# Patient Record
Sex: Male | Born: 1935 | Race: White | Hispanic: No | Smoking: Former smoker
Health system: Southern US, Community
[De-identification: ages and names within clinical notes are randomized; demographics above are authoritative.]

## PROBLEM LIST (undated history)

## (undated) DIAGNOSIS — Z992 Dependence on renal dialysis: Secondary | ICD-10-CM

## (undated) DIAGNOSIS — I10 Essential (primary) hypertension: Secondary | ICD-10-CM

## (undated) DIAGNOSIS — K219 Gastro-esophageal reflux disease without esophagitis: Secondary | ICD-10-CM

## (undated) DIAGNOSIS — N289 Disorder of kidney and ureter, unspecified: Secondary | ICD-10-CM

## (undated) DIAGNOSIS — N186 End stage renal disease: Secondary | ICD-10-CM

## (undated) DIAGNOSIS — I350 Nonrheumatic aortic (valve) stenosis: Secondary | ICD-10-CM

## (undated) DIAGNOSIS — E039 Hypothyroidism, unspecified: Secondary | ICD-10-CM

## (undated) DIAGNOSIS — M1990 Unspecified osteoarthritis, unspecified site: Secondary | ICD-10-CM

## (undated) HISTORY — PX: PROSTATE SURGERY: SHX751

## (undated) HISTORY — PX: EYE SURGERY: SHX253

## (undated) HISTORY — PX: TONSILLECTOMY: SUR1361

## (undated) HISTORY — PX: APPENDECTOMY: SHX54

## (undated) HISTORY — DX: Disorder of kidney and ureter, unspecified: N28.9

## (undated) HISTORY — PX: CATARACT EXTRACTION, BILATERAL: SHX1313

---

## 2002-12-18 DIAGNOSIS — C801 Malignant (primary) neoplasm, unspecified: Secondary | ICD-10-CM

## 2002-12-18 HISTORY — DX: Malignant (primary) neoplasm, unspecified: C80.1

## 2003-10-26 DIAGNOSIS — M129 Arthropathy, unspecified: Secondary | ICD-10-CM | POA: Insufficient documentation

## 2017-06-08 DIAGNOSIS — G4700 Insomnia, unspecified: Secondary | ICD-10-CM | POA: Insufficient documentation

## 2017-06-08 DIAGNOSIS — M545 Low back pain, unspecified: Secondary | ICD-10-CM | POA: Insufficient documentation

## 2017-06-08 DIAGNOSIS — I120 Hypertensive chronic kidney disease with stage 5 chronic kidney disease or end stage renal disease: Secondary | ICD-10-CM | POA: Insufficient documentation

## 2017-06-08 DIAGNOSIS — C61 Malignant neoplasm of prostate: Secondary | ICD-10-CM | POA: Insufficient documentation

## 2017-06-08 DIAGNOSIS — L400 Psoriasis vulgaris: Secondary | ICD-10-CM | POA: Insufficient documentation

## 2017-06-08 DIAGNOSIS — N140 Analgesic nephropathy: Secondary | ICD-10-CM | POA: Insufficient documentation

## 2017-06-08 DIAGNOSIS — G8929 Other chronic pain: Secondary | ICD-10-CM | POA: Insufficient documentation

## 2017-06-08 DIAGNOSIS — N2581 Secondary hyperparathyroidism of renal origin: Secondary | ICD-10-CM | POA: Insufficient documentation

## 2017-06-08 DIAGNOSIS — E039 Hypothyroidism, unspecified: Secondary | ICD-10-CM | POA: Insufficient documentation

## 2017-06-08 DIAGNOSIS — Z860100 Personal history of colon polyps, unspecified: Secondary | ICD-10-CM | POA: Insufficient documentation

## 2017-06-08 DIAGNOSIS — R32 Unspecified urinary incontinence: Secondary | ICD-10-CM | POA: Insufficient documentation

## 2017-06-08 DIAGNOSIS — Z791 Long term (current) use of non-steroidal anti-inflammatories (NSAID): Secondary | ICD-10-CM | POA: Insufficient documentation

## 2017-06-08 DIAGNOSIS — D509 Iron deficiency anemia, unspecified: Secondary | ICD-10-CM | POA: Insufficient documentation

## 2017-06-12 DIAGNOSIS — L299 Pruritus, unspecified: Secondary | ICD-10-CM | POA: Insufficient documentation

## 2017-06-12 DIAGNOSIS — D689 Coagulation defect, unspecified: Secondary | ICD-10-CM | POA: Insufficient documentation

## 2017-06-12 DIAGNOSIS — R0600 Dyspnea, unspecified: Secondary | ICD-10-CM | POA: Insufficient documentation

## 2017-06-12 DIAGNOSIS — R52 Pain, unspecified: Secondary | ICD-10-CM | POA: Insufficient documentation

## 2018-01-07 DIAGNOSIS — E039 Hypothyroidism, unspecified: Secondary | ICD-10-CM | POA: Diagnosis not present

## 2018-01-07 DIAGNOSIS — Z9181 History of falling: Secondary | ICD-10-CM | POA: Diagnosis not present

## 2018-01-07 DIAGNOSIS — N186 End stage renal disease: Secondary | ICD-10-CM | POA: Diagnosis not present

## 2018-01-07 DIAGNOSIS — N185 Chronic kidney disease, stage 5: Secondary | ICD-10-CM | POA: Diagnosis not present

## 2018-01-07 DIAGNOSIS — E559 Vitamin D deficiency, unspecified: Secondary | ICD-10-CM | POA: Diagnosis not present

## 2018-01-07 DIAGNOSIS — G4700 Insomnia, unspecified: Secondary | ICD-10-CM | POA: Diagnosis not present

## 2018-01-07 DIAGNOSIS — I129 Hypertensive chronic kidney disease with stage 1 through stage 4 chronic kidney disease, or unspecified chronic kidney disease: Secondary | ICD-10-CM | POA: Diagnosis not present

## 2018-01-07 DIAGNOSIS — Z1331 Encounter for screening for depression: Secondary | ICD-10-CM | POA: Diagnosis not present

## 2018-01-07 DIAGNOSIS — K5909 Other constipation: Secondary | ICD-10-CM | POA: Diagnosis not present

## 2018-01-07 DIAGNOSIS — E785 Hyperlipidemia, unspecified: Secondary | ICD-10-CM | POA: Diagnosis not present

## 2018-03-13 DIAGNOSIS — L578 Other skin changes due to chronic exposure to nonionizing radiation: Secondary | ICD-10-CM | POA: Diagnosis not present

## 2018-03-13 DIAGNOSIS — L821 Other seborrheic keratosis: Secondary | ICD-10-CM | POA: Diagnosis not present

## 2018-03-13 DIAGNOSIS — L570 Actinic keratosis: Secondary | ICD-10-CM | POA: Diagnosis not present

## 2018-05-08 DIAGNOSIS — E785 Hyperlipidemia, unspecified: Secondary | ICD-10-CM | POA: Diagnosis not present

## 2018-05-08 DIAGNOSIS — N185 Chronic kidney disease, stage 5: Secondary | ICD-10-CM | POA: Diagnosis not present

## 2018-05-08 DIAGNOSIS — Z6833 Body mass index (BMI) 33.0-33.9, adult: Secondary | ICD-10-CM | POA: Diagnosis not present

## 2018-05-08 DIAGNOSIS — E039 Hypothyroidism, unspecified: Secondary | ICD-10-CM | POA: Diagnosis not present

## 2018-05-08 DIAGNOSIS — I129 Hypertensive chronic kidney disease with stage 1 through stage 4 chronic kidney disease, or unspecified chronic kidney disease: Secondary | ICD-10-CM | POA: Diagnosis not present

## 2018-05-08 DIAGNOSIS — C61 Malignant neoplasm of prostate: Secondary | ICD-10-CM | POA: Diagnosis not present

## 2018-05-08 DIAGNOSIS — N186 End stage renal disease: Secondary | ICD-10-CM | POA: Diagnosis not present

## 2018-05-08 DIAGNOSIS — E559 Vitamin D deficiency, unspecified: Secondary | ICD-10-CM | POA: Diagnosis not present

## 2018-05-08 DIAGNOSIS — G4700 Insomnia, unspecified: Secondary | ICD-10-CM | POA: Diagnosis not present

## 2018-05-31 DIAGNOSIS — J208 Acute bronchitis due to other specified organisms: Secondary | ICD-10-CM | POA: Diagnosis not present

## 2018-06-14 DIAGNOSIS — C44319 Basal cell carcinoma of skin of other parts of face: Secondary | ICD-10-CM | POA: Diagnosis not present

## 2018-06-14 DIAGNOSIS — L821 Other seborrheic keratosis: Secondary | ICD-10-CM | POA: Diagnosis not present

## 2018-06-26 DIAGNOSIS — Z136 Encounter for screening for cardiovascular disorders: Secondary | ICD-10-CM | POA: Diagnosis not present

## 2018-06-26 DIAGNOSIS — E669 Obesity, unspecified: Secondary | ICD-10-CM | POA: Diagnosis not present

## 2018-06-26 DIAGNOSIS — Z1331 Encounter for screening for depression: Secondary | ICD-10-CM | POA: Diagnosis not present

## 2018-06-26 DIAGNOSIS — Z9181 History of falling: Secondary | ICD-10-CM | POA: Diagnosis not present

## 2018-06-26 DIAGNOSIS — Z6832 Body mass index (BMI) 32.0-32.9, adult: Secondary | ICD-10-CM | POA: Diagnosis not present

## 2018-06-26 DIAGNOSIS — Z0000 Encounter for general adult medical examination without abnormal findings: Secondary | ICD-10-CM | POA: Diagnosis not present

## 2018-06-26 DIAGNOSIS — E785 Hyperlipidemia, unspecified: Secondary | ICD-10-CM | POA: Diagnosis not present

## 2018-06-26 DIAGNOSIS — Z139 Encounter for screening, unspecified: Secondary | ICD-10-CM | POA: Diagnosis not present

## 2018-06-26 DIAGNOSIS — Z125 Encounter for screening for malignant neoplasm of prostate: Secondary | ICD-10-CM | POA: Diagnosis not present

## 2018-06-26 DIAGNOSIS — Z1339 Encounter for screening examination for other mental health and behavioral disorders: Secondary | ICD-10-CM | POA: Diagnosis not present

## 2018-07-08 DIAGNOSIS — C44329 Squamous cell carcinoma of skin of other parts of face: Secondary | ICD-10-CM | POA: Diagnosis not present

## 2018-08-21 DIAGNOSIS — Z23 Encounter for immunization: Secondary | ICD-10-CM | POA: Diagnosis not present

## 2018-08-21 DIAGNOSIS — S61412A Laceration without foreign body of left hand, initial encounter: Secondary | ICD-10-CM | POA: Diagnosis not present

## 2018-08-28 DIAGNOSIS — S61412A Laceration without foreign body of left hand, initial encounter: Secondary | ICD-10-CM | POA: Diagnosis not present

## 2018-08-28 DIAGNOSIS — G4700 Insomnia, unspecified: Secondary | ICD-10-CM | POA: Diagnosis not present

## 2018-08-28 DIAGNOSIS — J3489 Other specified disorders of nose and nasal sinuses: Secondary | ICD-10-CM | POA: Diagnosis not present

## 2018-08-28 DIAGNOSIS — Z6832 Body mass index (BMI) 32.0-32.9, adult: Secondary | ICD-10-CM | POA: Diagnosis not present

## 2018-09-05 DIAGNOSIS — G4700 Insomnia, unspecified: Secondary | ICD-10-CM | POA: Diagnosis not present

## 2018-09-05 DIAGNOSIS — N186 End stage renal disease: Secondary | ICD-10-CM | POA: Diagnosis not present

## 2018-09-05 DIAGNOSIS — E039 Hypothyroidism, unspecified: Secondary | ICD-10-CM | POA: Diagnosis not present

## 2018-09-05 DIAGNOSIS — E785 Hyperlipidemia, unspecified: Secondary | ICD-10-CM | POA: Diagnosis not present

## 2018-10-20 DIAGNOSIS — M79672 Pain in left foot: Secondary | ICD-10-CM | POA: Diagnosis not present

## 2018-11-26 DIAGNOSIS — L578 Other skin changes due to chronic exposure to nonionizing radiation: Secondary | ICD-10-CM | POA: Diagnosis not present

## 2018-11-26 DIAGNOSIS — C44329 Squamous cell carcinoma of skin of other parts of face: Secondary | ICD-10-CM | POA: Diagnosis not present

## 2018-11-26 DIAGNOSIS — L821 Other seborrheic keratosis: Secondary | ICD-10-CM | POA: Diagnosis not present

## 2018-11-26 DIAGNOSIS — L570 Actinic keratosis: Secondary | ICD-10-CM | POA: Diagnosis not present

## 2018-12-05 DIAGNOSIS — H02103 Unspecified ectropion of right eye, unspecified eyelid: Secondary | ICD-10-CM | POA: Diagnosis not present

## 2019-01-07 DIAGNOSIS — G4700 Insomnia, unspecified: Secondary | ICD-10-CM | POA: Diagnosis not present

## 2019-01-07 DIAGNOSIS — E785 Hyperlipidemia, unspecified: Secondary | ICD-10-CM | POA: Diagnosis not present

## 2019-01-07 DIAGNOSIS — E039 Hypothyroidism, unspecified: Secondary | ICD-10-CM | POA: Diagnosis not present

## 2019-01-07 DIAGNOSIS — N186 End stage renal disease: Secondary | ICD-10-CM | POA: Diagnosis not present

## 2019-02-25 DIAGNOSIS — E785 Hyperlipidemia, unspecified: Secondary | ICD-10-CM | POA: Diagnosis not present

## 2019-05-08 DIAGNOSIS — E785 Hyperlipidemia, unspecified: Secondary | ICD-10-CM | POA: Diagnosis not present

## 2019-05-08 DIAGNOSIS — C61 Malignant neoplasm of prostate: Secondary | ICD-10-CM | POA: Diagnosis not present

## 2019-05-08 DIAGNOSIS — G4700 Insomnia, unspecified: Secondary | ICD-10-CM | POA: Diagnosis not present

## 2019-05-08 DIAGNOSIS — K5909 Other constipation: Secondary | ICD-10-CM | POA: Diagnosis not present

## 2019-05-08 DIAGNOSIS — E039 Hypothyroidism, unspecified: Secondary | ICD-10-CM | POA: Diagnosis not present

## 2019-05-08 DIAGNOSIS — N186 End stage renal disease: Secondary | ICD-10-CM | POA: Diagnosis not present

## 2019-06-30 DIAGNOSIS — Z136 Encounter for screening for cardiovascular disorders: Secondary | ICD-10-CM | POA: Diagnosis not present

## 2019-06-30 DIAGNOSIS — E669 Obesity, unspecified: Secondary | ICD-10-CM | POA: Diagnosis not present

## 2019-06-30 DIAGNOSIS — Z6831 Body mass index (BMI) 31.0-31.9, adult: Secondary | ICD-10-CM | POA: Diagnosis not present

## 2019-06-30 DIAGNOSIS — Z0000 Encounter for general adult medical examination without abnormal findings: Secondary | ICD-10-CM | POA: Diagnosis not present

## 2019-06-30 DIAGNOSIS — Z139 Encounter for screening, unspecified: Secondary | ICD-10-CM | POA: Diagnosis not present

## 2019-06-30 DIAGNOSIS — Z9181 History of falling: Secondary | ICD-10-CM | POA: Diagnosis not present

## 2019-06-30 DIAGNOSIS — Z1331 Encounter for screening for depression: Secondary | ICD-10-CM | POA: Diagnosis not present

## 2019-06-30 DIAGNOSIS — E785 Hyperlipidemia, unspecified: Secondary | ICD-10-CM | POA: Diagnosis not present

## 2019-07-01 DIAGNOSIS — H02103 Unspecified ectropion of right eye, unspecified eyelid: Secondary | ICD-10-CM | POA: Diagnosis not present

## 2019-09-09 DIAGNOSIS — G8929 Other chronic pain: Secondary | ICD-10-CM | POA: Diagnosis not present

## 2019-09-09 DIAGNOSIS — E039 Hypothyroidism, unspecified: Secondary | ICD-10-CM | POA: Diagnosis not present

## 2019-09-09 DIAGNOSIS — N186 End stage renal disease: Secondary | ICD-10-CM | POA: Diagnosis not present

## 2019-09-09 DIAGNOSIS — Z992 Dependence on renal dialysis: Secondary | ICD-10-CM | POA: Diagnosis not present

## 2019-09-09 DIAGNOSIS — Z23 Encounter for immunization: Secondary | ICD-10-CM | POA: Diagnosis not present

## 2019-09-09 DIAGNOSIS — M545 Low back pain: Secondary | ICD-10-CM | POA: Diagnosis not present

## 2019-09-09 DIAGNOSIS — M25552 Pain in left hip: Secondary | ICD-10-CM | POA: Diagnosis not present

## 2019-09-09 DIAGNOSIS — G4700 Insomnia, unspecified: Secondary | ICD-10-CM | POA: Diagnosis not present

## 2019-09-09 DIAGNOSIS — M1612 Unilateral primary osteoarthritis, left hip: Secondary | ICD-10-CM | POA: Diagnosis not present

## 2019-09-09 DIAGNOSIS — E785 Hyperlipidemia, unspecified: Secondary | ICD-10-CM | POA: Diagnosis not present

## 2019-09-09 DIAGNOSIS — M5416 Radiculopathy, lumbar region: Secondary | ICD-10-CM | POA: Diagnosis not present

## 2019-10-02 DIAGNOSIS — M5136 Other intervertebral disc degeneration, lumbar region: Secondary | ICD-10-CM | POA: Diagnosis not present

## 2019-10-02 DIAGNOSIS — Z992 Dependence on renal dialysis: Secondary | ICD-10-CM | POA: Diagnosis not present

## 2019-10-02 DIAGNOSIS — N186 End stage renal disease: Secondary | ICD-10-CM | POA: Diagnosis not present

## 2019-10-09 DIAGNOSIS — M5136 Other intervertebral disc degeneration, lumbar region: Secondary | ICD-10-CM | POA: Diagnosis not present

## 2019-10-09 DIAGNOSIS — M48061 Spinal stenosis, lumbar region without neurogenic claudication: Secondary | ICD-10-CM | POA: Diagnosis not present

## 2019-10-09 DIAGNOSIS — M545 Low back pain: Secondary | ICD-10-CM | POA: Diagnosis not present

## 2019-10-16 DIAGNOSIS — M545 Low back pain: Secondary | ICD-10-CM | POA: Diagnosis not present

## 2019-11-06 DIAGNOSIS — M48061 Spinal stenosis, lumbar region without neurogenic claudication: Secondary | ICD-10-CM | POA: Diagnosis not present

## 2019-11-06 DIAGNOSIS — M545 Low back pain: Secondary | ICD-10-CM | POA: Diagnosis not present

## 2019-12-02 DIAGNOSIS — L821 Other seborrheic keratosis: Secondary | ICD-10-CM | POA: Diagnosis not present

## 2019-12-02 DIAGNOSIS — L570 Actinic keratosis: Secondary | ICD-10-CM | POA: Diagnosis not present

## 2019-12-02 DIAGNOSIS — L578 Other skin changes due to chronic exposure to nonionizing radiation: Secondary | ICD-10-CM | POA: Diagnosis not present

## 2020-01-15 DIAGNOSIS — Z992 Dependence on renal dialysis: Secondary | ICD-10-CM | POA: Diagnosis not present

## 2020-01-15 DIAGNOSIS — E785 Hyperlipidemia, unspecified: Secondary | ICD-10-CM | POA: Diagnosis not present

## 2020-01-15 DIAGNOSIS — M5136 Other intervertebral disc degeneration, lumbar region: Secondary | ICD-10-CM | POA: Diagnosis not present

## 2020-01-15 DIAGNOSIS — G4700 Insomnia, unspecified: Secondary | ICD-10-CM | POA: Diagnosis not present

## 2020-01-15 DIAGNOSIS — N186 End stage renal disease: Secondary | ICD-10-CM | POA: Diagnosis not present

## 2020-01-15 DIAGNOSIS — E039 Hypothyroidism, unspecified: Secondary | ICD-10-CM | POA: Diagnosis not present

## 2020-01-27 DIAGNOSIS — M545 Low back pain: Secondary | ICD-10-CM | POA: Diagnosis not present

## 2020-02-19 DIAGNOSIS — M545 Low back pain: Secondary | ICD-10-CM | POA: Diagnosis not present

## 2020-04-08 DIAGNOSIS — M545 Low back pain: Secondary | ICD-10-CM | POA: Diagnosis not present

## 2020-04-08 DIAGNOSIS — G8929 Other chronic pain: Secondary | ICD-10-CM | POA: Diagnosis not present

## 2020-04-08 DIAGNOSIS — M533 Sacrococcygeal disorders, not elsewhere classified: Secondary | ICD-10-CM | POA: Diagnosis not present

## 2020-04-09 DIAGNOSIS — M533 Sacrococcygeal disorders, not elsewhere classified: Secondary | ICD-10-CM | POA: Insufficient documentation

## 2020-04-21 DIAGNOSIS — M533 Sacrococcygeal disorders, not elsewhere classified: Secondary | ICD-10-CM | POA: Diagnosis not present

## 2020-05-13 DIAGNOSIS — F5104 Psychophysiologic insomnia: Secondary | ICD-10-CM | POA: Diagnosis not present

## 2020-05-13 DIAGNOSIS — N186 End stage renal disease: Secondary | ICD-10-CM | POA: Diagnosis not present

## 2020-05-13 DIAGNOSIS — E039 Hypothyroidism, unspecified: Secondary | ICD-10-CM | POA: Diagnosis not present

## 2020-05-13 DIAGNOSIS — E785 Hyperlipidemia, unspecified: Secondary | ICD-10-CM | POA: Diagnosis not present

## 2020-05-13 DIAGNOSIS — R002 Palpitations: Secondary | ICD-10-CM | POA: Diagnosis not present

## 2020-05-13 DIAGNOSIS — Z992 Dependence on renal dialysis: Secondary | ICD-10-CM | POA: Diagnosis not present

## 2020-05-26 DIAGNOSIS — M5136 Other intervertebral disc degeneration, lumbar region: Secondary | ICD-10-CM | POA: Diagnosis not present

## 2020-05-26 DIAGNOSIS — M47816 Spondylosis without myelopathy or radiculopathy, lumbar region: Secondary | ICD-10-CM | POA: Insufficient documentation

## 2020-05-26 DIAGNOSIS — M48061 Spinal stenosis, lumbar region without neurogenic claudication: Secondary | ICD-10-CM | POA: Diagnosis not present

## 2020-05-26 DIAGNOSIS — M533 Sacrococcygeal disorders, not elsewhere classified: Secondary | ICD-10-CM | POA: Diagnosis not present

## 2020-06-16 DIAGNOSIS — S51812A Laceration without foreign body of left forearm, initial encounter: Secondary | ICD-10-CM | POA: Diagnosis not present

## 2020-06-17 DIAGNOSIS — S51802A Unspecified open wound of left forearm, initial encounter: Secondary | ICD-10-CM | POA: Diagnosis not present

## 2020-07-01 DIAGNOSIS — H04123 Dry eye syndrome of bilateral lacrimal glands: Secondary | ICD-10-CM | POA: Diagnosis not present

## 2020-07-01 DIAGNOSIS — H04003 Unspecified dacryoadenitis, bilateral lacrimal glands: Secondary | ICD-10-CM | POA: Diagnosis not present

## 2020-07-06 DIAGNOSIS — Z1331 Encounter for screening for depression: Secondary | ICD-10-CM | POA: Diagnosis not present

## 2020-07-06 DIAGNOSIS — Z0000 Encounter for general adult medical examination without abnormal findings: Secondary | ICD-10-CM | POA: Diagnosis not present

## 2020-07-06 DIAGNOSIS — Z6831 Body mass index (BMI) 31.0-31.9, adult: Secondary | ICD-10-CM | POA: Diagnosis not present

## 2020-07-06 DIAGNOSIS — Z139 Encounter for screening, unspecified: Secondary | ICD-10-CM | POA: Diagnosis not present

## 2020-07-06 DIAGNOSIS — Z9181 History of falling: Secondary | ICD-10-CM | POA: Diagnosis not present

## 2020-07-06 DIAGNOSIS — E785 Hyperlipidemia, unspecified: Secondary | ICD-10-CM | POA: Diagnosis not present

## 2020-08-24 DIAGNOSIS — M48061 Spinal stenosis, lumbar region without neurogenic claudication: Secondary | ICD-10-CM | POA: Diagnosis not present

## 2020-08-24 DIAGNOSIS — M47816 Spondylosis without myelopathy or radiculopathy, lumbar region: Secondary | ICD-10-CM | POA: Diagnosis not present

## 2020-08-24 DIAGNOSIS — M533 Sacrococcygeal disorders, not elsewhere classified: Secondary | ICD-10-CM | POA: Diagnosis not present

## 2020-08-31 DIAGNOSIS — H5213 Myopia, bilateral: Secondary | ICD-10-CM | POA: Diagnosis not present

## 2020-08-31 DIAGNOSIS — H40031 Anatomical narrow angle, right eye: Secondary | ICD-10-CM | POA: Diagnosis not present

## 2020-09-23 DIAGNOSIS — R0602 Shortness of breath: Secondary | ICD-10-CM | POA: Diagnosis not present

## 2020-09-23 DIAGNOSIS — E785 Hyperlipidemia, unspecified: Secondary | ICD-10-CM | POA: Diagnosis not present

## 2020-09-23 DIAGNOSIS — J42 Unspecified chronic bronchitis: Secondary | ICD-10-CM | POA: Diagnosis not present

## 2020-09-23 DIAGNOSIS — E039 Hypothyroidism, unspecified: Secondary | ICD-10-CM | POA: Diagnosis not present

## 2020-09-23 DIAGNOSIS — R059 Cough, unspecified: Secondary | ICD-10-CM | POA: Diagnosis not present

## 2020-09-23 DIAGNOSIS — G4700 Insomnia, unspecified: Secondary | ICD-10-CM | POA: Diagnosis not present

## 2020-09-23 DIAGNOSIS — I700 Atherosclerosis of aorta: Secondary | ICD-10-CM | POA: Diagnosis not present

## 2020-09-23 DIAGNOSIS — Z992 Dependence on renal dialysis: Secondary | ICD-10-CM | POA: Diagnosis not present

## 2020-09-23 DIAGNOSIS — Z23 Encounter for immunization: Secondary | ICD-10-CM | POA: Diagnosis not present

## 2020-09-23 DIAGNOSIS — N186 End stage renal disease: Secondary | ICD-10-CM | POA: Diagnosis not present

## 2020-10-05 DIAGNOSIS — N62 Hypertrophy of breast: Secondary | ICD-10-CM | POA: Diagnosis not present

## 2020-10-05 DIAGNOSIS — N6310 Unspecified lump in the right breast, unspecified quadrant: Secondary | ICD-10-CM | POA: Diagnosis not present

## 2020-10-14 DIAGNOSIS — N6311 Unspecified lump in the right breast, upper outer quadrant: Secondary | ICD-10-CM | POA: Diagnosis not present

## 2020-10-14 DIAGNOSIS — N62 Hypertrophy of breast: Secondary | ICD-10-CM | POA: Diagnosis not present

## 2020-11-22 DIAGNOSIS — J208 Acute bronchitis due to other specified organisms: Secondary | ICD-10-CM | POA: Diagnosis not present

## 2020-11-22 DIAGNOSIS — B9689 Other specified bacterial agents as the cause of diseases classified elsewhere: Secondary | ICD-10-CM | POA: Diagnosis not present

## 2020-11-22 DIAGNOSIS — R55 Syncope and collapse: Secondary | ICD-10-CM | POA: Diagnosis not present

## 2020-12-02 DIAGNOSIS — L821 Other seborrheic keratosis: Secondary | ICD-10-CM | POA: Diagnosis not present

## 2020-12-02 DIAGNOSIS — L578 Other skin changes due to chronic exposure to nonionizing radiation: Secondary | ICD-10-CM | POA: Diagnosis not present

## 2020-12-02 DIAGNOSIS — L570 Actinic keratosis: Secondary | ICD-10-CM | POA: Diagnosis not present

## 2020-12-02 DIAGNOSIS — L728 Other follicular cysts of the skin and subcutaneous tissue: Secondary | ICD-10-CM | POA: Diagnosis not present

## 2020-12-14 DIAGNOSIS — M5136 Other intervertebral disc degeneration, lumbar region: Secondary | ICD-10-CM | POA: Diagnosis not present

## 2020-12-14 DIAGNOSIS — M47816 Spondylosis without myelopathy or radiculopathy, lumbar region: Secondary | ICD-10-CM | POA: Diagnosis not present

## 2020-12-14 DIAGNOSIS — M48061 Spinal stenosis, lumbar region without neurogenic claudication: Secondary | ICD-10-CM | POA: Diagnosis not present

## 2021-01-06 DIAGNOSIS — M47816 Spondylosis without myelopathy or radiculopathy, lumbar region: Secondary | ICD-10-CM | POA: Diagnosis not present

## 2021-01-20 DIAGNOSIS — M47816 Spondylosis without myelopathy or radiculopathy, lumbar region: Secondary | ICD-10-CM | POA: Diagnosis not present

## 2021-01-25 DIAGNOSIS — J42 Unspecified chronic bronchitis: Secondary | ICD-10-CM | POA: Diagnosis not present

## 2021-01-25 DIAGNOSIS — G4700 Insomnia, unspecified: Secondary | ICD-10-CM | POA: Diagnosis not present

## 2021-01-25 DIAGNOSIS — E039 Hypothyroidism, unspecified: Secondary | ICD-10-CM | POA: Diagnosis not present

## 2021-01-25 DIAGNOSIS — Z992 Dependence on renal dialysis: Secondary | ICD-10-CM | POA: Diagnosis not present

## 2021-01-25 DIAGNOSIS — N186 End stage renal disease: Secondary | ICD-10-CM | POA: Diagnosis not present

## 2021-01-25 DIAGNOSIS — E785 Hyperlipidemia, unspecified: Secondary | ICD-10-CM | POA: Diagnosis not present

## 2021-02-17 DIAGNOSIS — M47816 Spondylosis without myelopathy or radiculopathy, lumbar region: Secondary | ICD-10-CM | POA: Diagnosis not present

## 2021-02-25 ENCOUNTER — Telehealth: Payer: Self-pay

## 2021-02-25 NOTE — Telephone Encounter (Signed)
I called and spoke to patient reminding him that he needs to contact his [facility] so that he will be authorized for a new patient appt.  Pt verbalized understanding and said he is with the [facility] and appointments normally go through [facility]Dr. He will contact them ASAP.

## 2021-03-04 ENCOUNTER — Other Ambulatory Visit: Payer: Self-pay

## 2021-03-10 ENCOUNTER — Other Ambulatory Visit: Payer: Self-pay

## 2021-03-15 DIAGNOSIS — N390 Urinary tract infection, site not specified: Secondary | ICD-10-CM | POA: Diagnosis not present

## 2021-03-22 ENCOUNTER — Encounter: Payer: Non-veteran care | Admitting: Vascular Surgery

## 2021-03-22 ENCOUNTER — Other Ambulatory Visit (HOSPITAL_COMMUNITY): Payer: Non-veteran care

## 2021-05-24 DIAGNOSIS — Z992 Dependence on renal dialysis: Secondary | ICD-10-CM | POA: Diagnosis not present

## 2021-05-24 DIAGNOSIS — J42 Unspecified chronic bronchitis: Secondary | ICD-10-CM | POA: Diagnosis not present

## 2021-05-24 DIAGNOSIS — E039 Hypothyroidism, unspecified: Secondary | ICD-10-CM | POA: Diagnosis not present

## 2021-05-24 DIAGNOSIS — K5909 Other constipation: Secondary | ICD-10-CM | POA: Diagnosis not present

## 2021-05-24 DIAGNOSIS — N186 End stage renal disease: Secondary | ICD-10-CM | POA: Diagnosis not present

## 2021-05-24 DIAGNOSIS — E785 Hyperlipidemia, unspecified: Secondary | ICD-10-CM | POA: Diagnosis not present

## 2021-05-24 DIAGNOSIS — G4700 Insomnia, unspecified: Secondary | ICD-10-CM | POA: Diagnosis not present

## 2021-07-05 ENCOUNTER — Encounter: Payer: Non-veteran care | Admitting: Vascular Surgery

## 2021-07-08 DIAGNOSIS — S51011A Laceration without foreign body of right elbow, initial encounter: Secondary | ICD-10-CM | POA: Diagnosis not present

## 2021-07-19 ENCOUNTER — Other Ambulatory Visit: Payer: Self-pay

## 2021-07-19 DIAGNOSIS — N186 End stage renal disease: Secondary | ICD-10-CM | POA: Insufficient documentation

## 2021-07-19 DIAGNOSIS — I129 Hypertensive chronic kidney disease with stage 1 through stage 4 chronic kidney disease, or unspecified chronic kidney disease: Secondary | ICD-10-CM | POA: Insufficient documentation

## 2021-07-19 DIAGNOSIS — N200 Calculus of kidney: Secondary | ICD-10-CM | POA: Insufficient documentation

## 2021-07-19 DIAGNOSIS — E559 Vitamin D deficiency, unspecified: Secondary | ICD-10-CM | POA: Insufficient documentation

## 2021-07-19 DIAGNOSIS — J309 Allergic rhinitis, unspecified: Secondary | ICD-10-CM | POA: Insufficient documentation

## 2021-07-19 DIAGNOSIS — N189 Chronic kidney disease, unspecified: Secondary | ICD-10-CM | POA: Insufficient documentation

## 2021-07-19 DIAGNOSIS — D631 Anemia in chronic kidney disease: Secondary | ICD-10-CM | POA: Insufficient documentation

## 2021-07-19 DIAGNOSIS — E785 Hyperlipidemia, unspecified: Secondary | ICD-10-CM | POA: Insufficient documentation

## 2021-07-19 DIAGNOSIS — N183 Chronic kidney disease, stage 3 unspecified: Secondary | ICD-10-CM | POA: Insufficient documentation

## 2021-07-19 DIAGNOSIS — I10 Essential (primary) hypertension: Secondary | ICD-10-CM | POA: Insufficient documentation

## 2021-07-19 DIAGNOSIS — H9313 Tinnitus, bilateral: Secondary | ICD-10-CM | POA: Insufficient documentation

## 2021-07-19 DIAGNOSIS — E1122 Type 2 diabetes mellitus with diabetic chronic kidney disease: Secondary | ICD-10-CM | POA: Insufficient documentation

## 2021-07-19 DIAGNOSIS — K219 Gastro-esophageal reflux disease without esophagitis: Secondary | ICD-10-CM | POA: Insufficient documentation

## 2021-07-19 DIAGNOSIS — N184 Chronic kidney disease, stage 4 (severe): Secondary | ICD-10-CM | POA: Insufficient documentation

## 2021-07-19 DIAGNOSIS — E669 Obesity, unspecified: Secondary | ICD-10-CM | POA: Insufficient documentation

## 2021-07-29 DIAGNOSIS — R0981 Nasal congestion: Secondary | ICD-10-CM | POA: Insufficient documentation

## 2021-07-29 DIAGNOSIS — R070 Pain in throat: Secondary | ICD-10-CM | POA: Insufficient documentation

## 2021-08-02 ENCOUNTER — Ambulatory Visit (HOSPITAL_COMMUNITY): Payer: Non-veteran care

## 2021-08-02 ENCOUNTER — Encounter: Payer: Non-veteran care | Admitting: Vascular Surgery

## 2021-08-12 DIAGNOSIS — Z8616 Personal history of COVID-19: Secondary | ICD-10-CM | POA: Insufficient documentation

## 2021-08-23 ENCOUNTER — Encounter: Payer: Self-pay | Admitting: Vascular Surgery

## 2021-08-23 ENCOUNTER — Other Ambulatory Visit: Payer: Self-pay

## 2021-08-23 ENCOUNTER — Ambulatory Visit (INDEPENDENT_AMBULATORY_CARE_PROVIDER_SITE_OTHER): Payer: No Typology Code available for payment source | Admitting: Vascular Surgery

## 2021-08-23 VITALS — BP 150/78 | HR 70 | Temp 97.5°F | Resp 18 | Ht 69.0 in | Wt 190.0 lb

## 2021-08-23 DIAGNOSIS — N186 End stage renal disease: Secondary | ICD-10-CM

## 2021-08-23 NOTE — Progress Notes (Signed)
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  Patient states his fistula was placed at [facility] about 4-5 years ago.  He is here with his daughter-in-law and has been on dialysis about 4 to 5 years.  He has had no significant bleeding events.  He has no open ulcerations or scabs at this time.  Sounds like the fistula is working well and has had one previous intervention and [facility].  Past Medical History:  Diagnosis Date   Kidney disease     Past Surgical History:  Procedure Laterality Date   APPENDECTOMY     PROSTATE SURGERY      Family History  Problem Relation Age of Onset   Cancer Mother    Heart failure Father     SOCIAL HISTORY: Social History   Socioeconomic History   Marital status: Unknown    Spouse name: Not on file   Number of children: Not on file   Years of education: Not on file   Highest education level: Not on file  Occupational History   Not on file  Tobacco Use   Smoking status: Former    Types: Cigarettes    Quit date: 63    Years since quitting: 50.7   Smokeless tobacco: Former  Substance and Sexual Activity   Alcohol use: Not Currently   Drug use: Never   Sexual activity: Not on file  Other Topics Concern   Not on file  Social History Narrative   Not on file   Social Determinants of Health   Financial Resource Strain: Not on file  Food Insecurity: Not on file  Transportation Needs: Not on file  Physical Activity: Not on file  Stress: Not on file  Social Connections: Not on file  Intimate Partner Violence: Not on file    Allergies  Allergen Reactions   Penicillins Anaphylaxis, Rash and Other (See Comments)    Fainted  like Fainted like    Pravastatin Other (See Comments)   Simvastatin Other (See Comments)    Current Outpatient Medications  Medication Sig Dispense Refill   aspirin 81 MG EC tablet Take by mouth.     b complex-C-folic acid 1 MG capsule Take 1 capsule by mouth at bedtime.     calcium acetate (PHOSLO) 667 MG capsule TAKE TWO CAPSULES BY MOUTH TWICE DAILYAT LUNCH AND SUPPER     calcium acetate (PHOSLO) 667 MG capsule Take 1,334 mg by mouth 2 (two) times daily.     gabapentin (NEURONTIN) 100 MG capsule Take by mouth.     levothyroxine (SYNTHROID) 88 MCG tablet Take by mouth.     lidocaine (XYLOCAINE) 5 % ointment APPLY A SMALL AMOUNT TO AFFECTED AREA TWICE DAILY AS NEEDED NECK, BACK OR JOINT PAIN     melatonin 3 MG TABS tablet TAKE ONE CAP/TAB BY MOUTH AT BEDTIME AS NEEDED FOR SLEEP     Menthol-Methyl Salicylate (THERA-GESIC) 0.5-15 % CREA APPLY A SMALL AMOUNT TO AFFECTED AREA FOUR TIMES A DAY NECK, BACK OR JOINT PAIN     multivitamin (RENA-VIT) TABS tablet Take by mouth.     Omega-3 Fatty Acids (SM FISH OIL) 1000 MG CAPS Take by mouth.  rosuvastatin (CRESTOR) 20 MG tablet Take 20 mg by mouth daily.     temazepam (RESTORIL) 30 MG capsule Take 30 mg by mouth at bedtime as needed.     No current facility-administered medications for this visit.    REVIEW OF SYSTEMS:  [X]  denotes positive finding, [ ]  denotes negative finding Cardiac  Comments:  Chest pain or chest pressure:    Shortness of breath upon exertion:    Short of breath when lying flat:    Irregular heart rhythm:        Vascular    Pain in calf, thigh, or hip brought on by ambulation:    Pain in feet at night that wakes you up from your sleep:     Blood clot in your veins:    Leg swelling:         Pulmonary    Oxygen at home:    Productive cough:     Wheezing:         Neurologic    Sudden weakness in arms or legs:     Sudden numbness in arms or legs:     Sudden onset of difficulty speaking or slurred speech:     Temporary loss of vision in one eye:     Problems with dizziness:         Gastrointestinal    Blood in stool:     Vomited blood:         Genitourinary    Burning when urinating:     Blood in urine:        Psychiatric    Major depression:         Hematologic    Bleeding problems:    Problems with blood clotting too easily:        Skin    Rashes or ulcers:        Constitutional    Fever or chills:      PHYSICAL EXAM: Vitals:   08/23/21 1116  BP: (!) 150/78  Pulse: 70  Resp: 18  Temp: (!) 97.5 F (36.4 C)  TempSrc: Temporal  SpO2: 93%  Weight: 190 lb (86.2 kg)  Height: 5\' 9"  (1.753 m)    GENERAL: The patient is a well-nourished male, in no acute distress. The vital signs are documented above. CARDIAC: There is a regular rate and rhythm.  VASCULAR:  Left radiocephalic AV fistula with excellent thrill. Aneurysmal segment of AVF pictured below. PULMONARY: No respiratory distress ABDOMEN: Soft and non-tender. MUSCULOSKELETAL: There are no major deformities or cyanosis. NEUROLOGIC: No focal weakness or paresthesias are detected. SKIN: There are no ulcers or rashes noted. PSYCHIATRIC: The patient has a normal affect.     DATA:   None  Assessment/Plan:  87 year old male presents for evaluation of aneurysmal segment of his left radiocephalic AV fistula.  As pictured, above the fistula does have an aneurysmal segment but I do not see any open ulcerations or scabs.  The skin here has some hypopigmentation but appears intact.  I do not see any immediate indication for revision as I discussed with him and his daughter-in-law today.  I will have him come back in a month to see one of our PAs just for another check on his fistula and ensure there has been no interval change.  I did give him my business card and discussed he call if he has any immediate concerns and we would be happy to see him back.   Marty Heck, MD [facility]  [place] Office: [phone]

## 2021-09-06 DIAGNOSIS — H40013 Open angle with borderline findings, low risk, bilateral: Secondary | ICD-10-CM | POA: Diagnosis not present

## 2021-09-20 ENCOUNTER — Encounter: Payer: Self-pay | Admitting: Physician Assistant

## 2021-09-20 ENCOUNTER — Other Ambulatory Visit: Payer: Self-pay

## 2021-09-20 ENCOUNTER — Ambulatory Visit (INDEPENDENT_AMBULATORY_CARE_PROVIDER_SITE_OTHER): Payer: No Typology Code available for payment source | Admitting: Physician Assistant

## 2021-09-20 VITALS — BP 153/71 | HR 73 | Temp 97.5°F | Resp 20 | Ht 69.0 in | Wt 191.5 lb

## 2021-09-20 DIAGNOSIS — N186 End stage renal disease: Secondary | ICD-10-CM | POA: Diagnosis not present

## 2021-09-20 DIAGNOSIS — R059 Cough, unspecified: Secondary | ICD-10-CM | POA: Insufficient documentation

## 2021-09-20 DIAGNOSIS — H903 Sensorineural hearing loss, bilateral: Secondary | ICD-10-CM | POA: Insufficient documentation

## 2021-09-20 DIAGNOSIS — R809 Proteinuria, unspecified: Secondary | ICD-10-CM | POA: Insufficient documentation

## 2021-09-20 DIAGNOSIS — E872 Acidosis, unspecified: Secondary | ICD-10-CM | POA: Insufficient documentation

## 2021-09-20 DIAGNOSIS — N250 Renal osteodystrophy: Secondary | ICD-10-CM | POA: Insufficient documentation

## 2021-09-20 DIAGNOSIS — Q619 Cystic kidney disease, unspecified: Secondary | ICD-10-CM | POA: Insufficient documentation

## 2021-09-20 DIAGNOSIS — K5900 Constipation, unspecified: Secondary | ICD-10-CM | POA: Insufficient documentation

## 2021-09-20 DIAGNOSIS — R319 Hematuria, unspecified: Secondary | ICD-10-CM | POA: Insufficient documentation

## 2021-09-20 DIAGNOSIS — R519 Headache, unspecified: Secondary | ICD-10-CM | POA: Insufficient documentation

## 2021-09-20 DIAGNOSIS — J329 Chronic sinusitis, unspecified: Secondary | ICD-10-CM | POA: Insufficient documentation

## 2021-09-20 DIAGNOSIS — M25519 Pain in unspecified shoulder: Secondary | ICD-10-CM | POA: Insufficient documentation

## 2021-09-20 DIAGNOSIS — H524 Presbyopia: Secondary | ICD-10-CM | POA: Insufficient documentation

## 2021-09-20 DIAGNOSIS — M159 Polyosteoarthritis, unspecified: Secondary | ICD-10-CM | POA: Insufficient documentation

## 2021-09-20 NOTE — Progress Notes (Signed)
  POST OPERATIVE OFFICE NOTE    CC:  F/u for surgery  HPI:  This is a 87 y.o. male who is for follow up check on his left AV fistula with aneurysmal.  He denise prolonged bleeding, difficulty with HD, or symptoms of steal.  The fistula was created 5 years ago a t the [place]. He was seen by Dr. Carlis Abbott and is here to f/u.  He has HD MWF in [place].      Allergies  Allergen Reactions   Penicillins Anaphylaxis, Rash and Other (See Comments)    Fainted like Fainted like    Pravastatin Other (See Comments)   Simvastatin Other (See Comments)    Current Outpatient Medications  Medication Sig Dispense Refill   aspirin 81 MG EC tablet Take by mouth.     b complex-C-folic acid 1 MG capsule Take 1 capsule by mouth at bedtime.     calcium acetate (PHOSLO) 667 MG capsule TAKE TWO CAPSULES BY MOUTH TWICE DAILYAT LUNCH AND SUPPER     calcium acetate (PHOSLO) 667 MG capsule Take 1,334 mg by mouth 2 (two) times daily.     Cinacalcet HCl (SENSIPAR PO) Take by mouth.     ethyl chloride spray PLEASE SEE ATTACHED FOR DETAILED DIRECTIONS     famotidine (PEPCID) 40 MG tablet Take 1 tablet by mouth at bedtime.     gabapentin (NEURONTIN) 100 MG capsule Take by mouth.     heparin 1000 unit/mL SOLN injection Heparin Sodium (Porcine) 1,000 Units/mL Systemic     levothyroxine (SYNTHROID) 88 MCG tablet Take by mouth.     lidocaine (XYLOCAINE) 5 % ointment APPLY A SMALL AMOUNT TO AFFECTED AREA TWICE DAILY AS NEEDED NECK, BACK OR JOINT PAIN     linaclotide (LINZESS) 290 MCG CAPS capsule TAKE 1 CAPSULE BY MOUTH EVERY OTHER DAY AS NEEDED     melatonin 3 MG TABS tablet TAKE ONE CAP/TAB BY MOUTH AT BEDTIME AS NEEDED FOR SLEEP     Menthol-Methyl Salicylate (THERA-GESIC) 0.5-15 % CREA APPLY A SMALL AMOUNT TO AFFECTED AREA FOUR TIMES A DAY NECK, BACK OR JOINT PAIN     Methoxy PEG-Epoetin Beta (MIRCERA IJ) Mircera     multivitamin (RENA-VIT) TABS tablet Take by mouth.     omega-3 acid ethyl esters (LOVAZA) 1 g capsule Take  by mouth.     rosuvastatin (CRESTOR) 20 MG tablet Take 20 mg by mouth daily.     senna-docusate (SENOKOT-S) 8.6-50 MG tablet TAKE 2 TABLETS BY MOUTH ONCE DAILY CONSTIPATION     temazepam (RESTORIL) 30 MG capsule Take 30 mg by mouth at bedtime as needed.     VITAMIN D PO Take by mouth.     No current facility-administered medications for this visit.     ROS:  See HPI  Physical Exam:     Extremities:  palpable thrill in fistula, radial pulses palpable left UE, grip 5/5 He has thinning skin over the dorsum of the fistula, no ulcer/scab, or erythema.     Assessment/Plan:  This is a 87 y.o. male who is s/p: The fistula appears viable.  The only reason we would plicate the fistula is if he had prolonged bleeding, problems with HD flow rates, increased aneurysmal size in a short time or ulcer/scab formation.  F/U PRN.  No intervention is needed at this time.       Roxy Horseman PA-C [facility] [phone]   Clinic MD:  Carlis Abbott

## 2021-09-27 DIAGNOSIS — G4700 Insomnia, unspecified: Secondary | ICD-10-CM | POA: Diagnosis not present

## 2021-09-27 DIAGNOSIS — Z992 Dependence on renal dialysis: Secondary | ICD-10-CM | POA: Diagnosis not present

## 2021-09-27 DIAGNOSIS — E039 Hypothyroidism, unspecified: Secondary | ICD-10-CM | POA: Diagnosis not present

## 2021-09-27 DIAGNOSIS — K5909 Other constipation: Secondary | ICD-10-CM | POA: Diagnosis not present

## 2021-09-27 DIAGNOSIS — N186 End stage renal disease: Secondary | ICD-10-CM | POA: Diagnosis not present

## 2021-09-27 DIAGNOSIS — E785 Hyperlipidemia, unspecified: Secondary | ICD-10-CM | POA: Diagnosis not present

## 2021-09-27 DIAGNOSIS — J42 Unspecified chronic bronchitis: Secondary | ICD-10-CM | POA: Diagnosis not present

## 2021-12-06 DIAGNOSIS — L578 Other skin changes due to chronic exposure to nonionizing radiation: Secondary | ICD-10-CM | POA: Diagnosis not present

## 2021-12-06 DIAGNOSIS — L821 Other seborrheic keratosis: Secondary | ICD-10-CM | POA: Diagnosis not present

## 2021-12-06 DIAGNOSIS — L570 Actinic keratosis: Secondary | ICD-10-CM | POA: Diagnosis not present

## 2022-01-28 DIAGNOSIS — K5909 Other constipation: Secondary | ICD-10-CM | POA: Diagnosis not present

## 2022-01-31 DIAGNOSIS — G4700 Insomnia, unspecified: Secondary | ICD-10-CM | POA: Diagnosis not present

## 2022-01-31 DIAGNOSIS — J42 Unspecified chronic bronchitis: Secondary | ICD-10-CM | POA: Diagnosis not present

## 2022-01-31 DIAGNOSIS — K5909 Other constipation: Secondary | ICD-10-CM | POA: Diagnosis not present

## 2022-01-31 DIAGNOSIS — E785 Hyperlipidemia, unspecified: Secondary | ICD-10-CM | POA: Diagnosis not present

## 2022-01-31 DIAGNOSIS — Z992 Dependence on renal dialysis: Secondary | ICD-10-CM | POA: Diagnosis not present

## 2022-01-31 DIAGNOSIS — M65351 Trigger finger, right little finger: Secondary | ICD-10-CM | POA: Diagnosis not present

## 2022-01-31 DIAGNOSIS — N186 End stage renal disease: Secondary | ICD-10-CM | POA: Diagnosis not present

## 2022-01-31 DIAGNOSIS — E039 Hypothyroidism, unspecified: Secondary | ICD-10-CM | POA: Diagnosis not present

## 2022-02-15 DIAGNOSIS — I639 Cerebral infarction, unspecified: Secondary | ICD-10-CM

## 2022-02-15 HISTORY — DX: Cerebral infarction, unspecified: I63.9

## 2022-02-20 ENCOUNTER — Observation Stay (HOSPITAL_BASED_OUTPATIENT_CLINIC_OR_DEPARTMENT_OTHER): Payer: No Typology Code available for payment source

## 2022-02-20 ENCOUNTER — Emergency Department (HOSPITAL_COMMUNITY): Payer: No Typology Code available for payment source

## 2022-02-20 ENCOUNTER — Encounter (HOSPITAL_COMMUNITY): Payer: Self-pay

## 2022-02-20 ENCOUNTER — Inpatient Hospital Stay (HOSPITAL_COMMUNITY)
Admission: EM | Admit: 2022-02-20 | Discharge: 2022-02-22 | DRG: 067 | Disposition: A | Discharge status: Home Health | Payer: No Typology Code available for payment source | Attending: Family Medicine | Admitting: Family Medicine

## 2022-02-20 DIAGNOSIS — I779 Disorder of arteries and arterioles, unspecified: Secondary | ICD-10-CM | POA: Diagnosis not present

## 2022-02-20 DIAGNOSIS — Z87891 Personal history of nicotine dependence: Secondary | ICD-10-CM | POA: Diagnosis not present

## 2022-02-20 DIAGNOSIS — E1122 Type 2 diabetes mellitus with diabetic chronic kidney disease: Secondary | ICD-10-CM | POA: Diagnosis present

## 2022-02-20 DIAGNOSIS — I6523 Occlusion and stenosis of bilateral carotid arteries: Secondary | ICD-10-CM | POA: Diagnosis not present

## 2022-02-20 DIAGNOSIS — I120 Hypertensive chronic kidney disease with stage 5 chronic kidney disease or end stage renal disease: Secondary | ICD-10-CM | POA: Diagnosis present

## 2022-02-20 DIAGNOSIS — Z888 Allergy status to other drugs, medicaments and biological substances status: Secondary | ICD-10-CM | POA: Diagnosis not present

## 2022-02-20 DIAGNOSIS — G8929 Other chronic pain: Secondary | ICD-10-CM | POA: Diagnosis not present

## 2022-02-20 DIAGNOSIS — Z8546 Personal history of malignant neoplasm of prostate: Secondary | ICD-10-CM | POA: Diagnosis not present

## 2022-02-20 DIAGNOSIS — H8110 Benign paroxysmal vertigo, unspecified ear: Secondary | ICD-10-CM | POA: Diagnosis not present

## 2022-02-20 DIAGNOSIS — Z880 Allergy status to penicillin: Secondary | ICD-10-CM | POA: Diagnosis not present

## 2022-02-20 DIAGNOSIS — R42 Dizziness and giddiness: Secondary | ICD-10-CM

## 2022-02-20 DIAGNOSIS — D631 Anemia in chronic kidney disease: Secondary | ICD-10-CM | POA: Diagnosis not present

## 2022-02-20 DIAGNOSIS — E7800 Pure hypercholesterolemia, unspecified: Secondary | ICD-10-CM | POA: Diagnosis not present

## 2022-02-20 DIAGNOSIS — I6522 Occlusion and stenosis of left carotid artery: Secondary | ICD-10-CM | POA: Diagnosis not present

## 2022-02-20 DIAGNOSIS — Z7982 Long term (current) use of aspirin: Secondary | ICD-10-CM | POA: Diagnosis not present

## 2022-02-20 DIAGNOSIS — Z20822 Contact with and (suspected) exposure to covid-19: Secondary | ICD-10-CM | POA: Diagnosis present

## 2022-02-20 DIAGNOSIS — H8113 Benign paroxysmal vertigo, bilateral: Secondary | ICD-10-CM | POA: Diagnosis present

## 2022-02-20 DIAGNOSIS — M545 Low back pain, unspecified: Secondary | ICD-10-CM | POA: Diagnosis not present

## 2022-02-20 DIAGNOSIS — E118 Type 2 diabetes mellitus with unspecified complications: Secondary | ICD-10-CM

## 2022-02-20 DIAGNOSIS — Z8249 Family history of ischemic heart disease and other diseases of the circulatory system: Secondary | ICD-10-CM | POA: Diagnosis not present

## 2022-02-20 DIAGNOSIS — Z992 Dependence on renal dialysis: Secondary | ICD-10-CM

## 2022-02-20 DIAGNOSIS — I639 Cerebral infarction, unspecified: Secondary | ICD-10-CM | POA: Diagnosis not present

## 2022-02-20 DIAGNOSIS — I10 Essential (primary) hypertension: Secondary | ICD-10-CM | POA: Diagnosis present

## 2022-02-20 DIAGNOSIS — H5509 Other forms of nystagmus: Secondary | ICD-10-CM | POA: Diagnosis present

## 2022-02-20 DIAGNOSIS — E785 Hyperlipidemia, unspecified: Secondary | ICD-10-CM | POA: Diagnosis not present

## 2022-02-20 DIAGNOSIS — N2581 Secondary hyperparathyroidism of renal origin: Secondary | ICD-10-CM | POA: Diagnosis present

## 2022-02-20 DIAGNOSIS — N186 End stage renal disease: Secondary | ICD-10-CM | POA: Diagnosis not present

## 2022-02-20 DIAGNOSIS — E039 Hypothyroidism, unspecified: Secondary | ICD-10-CM | POA: Diagnosis present

## 2022-02-20 DIAGNOSIS — I6389 Other cerebral infarction: Secondary | ICD-10-CM | POA: Diagnosis not present

## 2022-02-20 LAB — RESP PANEL BY RT-PCR (FLU A&B, COVID) ARPGX2
Influenza A by PCR: NEGATIVE
Influenza B by PCR: NEGATIVE
SARS Coronavirus 2 by RT PCR: NEGATIVE

## 2022-02-20 LAB — BASIC METABOLIC PANEL
Anion gap: 13 (ref 5–15)
BUN: 57 mg/dL — ABNORMAL HIGH (ref 8–23)
CO2: 26 mmol/L (ref 22–32)
Calcium: 8.9 mg/dL (ref 8.9–10.3)
Chloride: 102 mmol/L (ref 98–111)
Creatinine, Ser: 9.67 mg/dL — ABNORMAL HIGH (ref 0.61–1.24)
GFR, Estimated: 5 mL/min — ABNORMAL LOW (ref >60)
Glucose, Bld: 131 mg/dL — ABNORMAL HIGH (ref 70–99)
Potassium: 4.2 mmol/L (ref 3.5–5.1)
Sodium: 141 mmol/L (ref 135–145)

## 2022-02-20 LAB — CBC
HCT: 33 % — ABNORMAL LOW (ref 39.0–52.0)
Hemoglobin: 10.5 g/dL — ABNORMAL LOW (ref 13.0–17.0)
MCH: 34.5 pg — ABNORMAL HIGH (ref 26.0–34.0)
MCHC: 31.8 g/dL (ref 30.0–36.0)
MCV: 108.6 fL — ABNORMAL HIGH (ref 80.0–100.0)
Platelets: 257 10*3/uL (ref 150–400)
RBC: 3.04 MIL/uL — ABNORMAL LOW (ref 4.22–5.81)
RDW: 14.4 % (ref 11.5–15.5)
WBC: 7.5 10*3/uL (ref 4.0–10.5)
nRBC: 0 % (ref 0.0–0.2)

## 2022-02-20 LAB — TSH: TSH: 2.142 u[IU]/mL (ref 0.350–4.500)

## 2022-02-20 LAB — ETHANOL: Alcohol, Ethyl (B): <10 mg/dL (ref <10)

## 2022-02-20 MED ORDER — ASPIRIN EC 81 MG PO TBEC
81.0000 mg | DELAYED_RELEASE_TABLET | Freq: Every day | ORAL | Status: DC
  Administered 2022-02-22: 81 mg via ORAL
  Filled 2022-02-20: qty 1

## 2022-02-20 MED ORDER — STROKE: EARLY STAGES OF RECOVERY BOOK
Freq: Once | Status: AC
  Filled 2022-02-20: qty 1

## 2022-02-20 MED ORDER — LEVOTHYROXINE SODIUM 88 MCG PO TABS
88.0000 ug | ORAL_TABLET | Freq: Every day | ORAL | Status: DC
  Administered 2022-02-21 – 2022-02-22 (×2): 88 ug via ORAL
  Filled 2022-02-20 (×3): qty 1

## 2022-02-20 MED ORDER — CALCIUM ACETATE (PHOS BINDER) 667 MG PO CAPS
1334.0000 mg | ORAL_CAPSULE | Freq: Two times a day (BID) | ORAL | Status: DC
  Administered 2022-02-22: 1334 mg via ORAL
  Filled 2022-02-20: qty 2

## 2022-02-20 MED ORDER — LIDOCAINE 5 % EX PTCH
1.0000 | MEDICATED_PATCH | CUTANEOUS | Status: DC
  Administered 2022-02-20 – 2022-02-21 (×2): 1 via TRANSDERMAL
  Filled 2022-02-20 (×3): qty 1

## 2022-02-20 MED ORDER — SENNOSIDES-DOCUSATE SODIUM 8.6-50 MG PO TABS: 1.0000 | ORAL_TABLET | Freq: Every evening | ORAL | Status: DC | PRN

## 2022-02-20 MED ORDER — CLOPIDOGREL BISULFATE 75 MG PO TABS
75.0000 mg | ORAL_TABLET | Freq: Every day | ORAL | Status: DC
  Administered 2022-02-22: 75 mg via ORAL
  Filled 2022-02-20: qty 1

## 2022-02-20 MED ORDER — ACETAMINOPHEN 650 MG RE SUPP: 650.0000 mg | RECTAL | Status: DC | PRN

## 2022-02-20 MED ORDER — MECLIZINE HCL 12.5 MG PO TABS
25.0000 mg | ORAL_TABLET | Freq: Two times a day (BID) | ORAL | Status: DC | PRN
  Administered 2022-02-21: 25 mg via ORAL
  Filled 2022-02-20 (×2): qty 2

## 2022-02-20 MED ORDER — ACETAMINOPHEN 325 MG PO TABS: 650.0000 mg | ORAL_TABLET | ORAL | Status: DC | PRN

## 2022-02-20 MED ORDER — HEPARIN SODIUM (PORCINE) 5000 UNIT/ML IJ SOLN
5000.0000 [IU] | Freq: Three times a day (TID) | INTRAMUSCULAR | Status: DC
  Administered 2022-02-20 – 2022-02-22 (×4): 5000 [IU] via SUBCUTANEOUS
  Filled 2022-02-20 (×4): qty 1

## 2022-02-20 MED ORDER — ROSUVASTATIN CALCIUM 20 MG PO TABS
20.0000 mg | ORAL_TABLET | Freq: Every day | ORAL | Status: DC
  Administered 2022-02-20 – 2022-02-22 (×2): 20 mg via ORAL
  Filled 2022-02-20 (×2): qty 1

## 2022-02-20 MED ORDER — IOHEXOL 350 MG/ML SOLN
75.0000 mL | Freq: Once | INTRAVENOUS | Status: AC | PRN
  Administered 2022-02-20: 75 mL via INTRAVENOUS

## 2022-02-20 MED ORDER — ACETAMINOPHEN 160 MG/5ML PO SOLN: 650.0000 mg | ORAL | Status: DC | PRN

## 2022-02-20 MED ORDER — MECLIZINE HCL 25 MG PO TABS
25.0000 mg | ORAL_TABLET | Freq: Once | ORAL | Status: AC
  Administered 2022-02-20: 25 mg via ORAL
  Filled 2022-02-20: qty 1

## 2022-02-20 NOTE — Assessment & Plan Note (Addendum)
Do not have baseline labs to see what he normally runs and can not find in [place] notes.  ?-will check b12/folate with macrocytosis  ?Continue to monitor  ?

## 2022-02-20 NOTE — Assessment & Plan Note (Addendum)
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 Also has harsh systolic murmur and hx consistent with orthostasis as well as carotid artery disease.   ?-observation to telemetry ?-orthostatic vitals today and tomorrow-place TED hose  ?-neurology consulted ?-Neurochecks per protocol ?-Neurology consulted ?-echo: check for AS as well  ?-fasting lipid panel/A1C ?-Continue daily aspirin 81 mg and plavix per neurology recs.  ?-Permissive hypertension first 24 hours <220/110 ?-PT/OT with dizziness  ?-carotid w/u see #2 ?-meclizine prn  ? ?

## 2022-02-20 NOTE — Assessment & Plan Note (Signed)
Continue crestor '20mg'$ , goal LDL <70 ?Fasting lipid panel for AM  ?

## 2022-02-20 NOTE — Assessment & Plan Note (Addendum)
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 Also severe, near occlusive stenosis at the origin of the right external carotid artery.  ?-have consulted vascular-Dr. Donzetta Matters who will see him tonight or tomorrow  ?-check carotid US  ?-continue ASA/statin  ?

## 2022-02-20 NOTE — ED Triage Notes (Addendum)
Pt arrives POV from dialysis center for onset of dizziness. Pt reports the last 4 mornings he has woken up and sat on the side of the bed and felt "drunk as can be". Reports he feels severe dizziness and "the clock on the wall is moving all over". Denies LOC, although states he feels as though he may pass out. Denies pain or HA, denies NT, no unilateral motor deficits or weakness. Dialysis days M/W/F- did not receive dialysis today d/t dizziness ?

## 2022-02-20 NOTE — ED Provider Notes (Signed)
[facility] Provider Note   CSN: [id number] Arrival date & time: 02/20/22  1029     History  Chief Complaint  Patient presents with   Dizziness    Noah Osborn is a 88 y.o. male.  HPI 88 year old male presents with dizziness.  Ongoing for about 4 days, mostly in the mornings.  However today it was worse and more persistent.  Over the last 4 days he has been waking up and he will notice that when he looks at the clock it will seem like it is moving up and down.  When he tries to get up to walk he will feel like he is going to fall over forward or backwards.  No headache associated with this.  No visual complaints.  Typically last less than an hour and then seems to go away.  However today it recurred and has not gotten better.  He is due for dialysis today but skipped it to come here.  His daughter notes that he did have his dialysis most recently on 3/3.  He denies significant medical problems besides the dialysis.  He denies chest pain, syncope, palpitations, or shortness of breath.  No recent illness.  Home Medications Prior to Admission medications   Medication Sig Start Date End Date Taking? Authorizing Provider  aspirin 81 MG EC tablet Take by mouth. 07/22/13   [provider]  b complex-C-folic acid 1 MG capsule Take 1 capsule by mouth at bedtime. 07/19/21   [provider]  calcium acetate (PHOSLO) 667 MG capsule TAKE TWO CAPSULES BY MOUTH TWICE DAILYAT LUNCH AND SUPPER 08/22/18 02/26/22  [provider]  calcium acetate (PHOSLO) 667 MG capsule Take 1,334 mg by mouth 2 (two) times daily. 02/25/21   [provider]  Cinacalcet HCl (SENSIPAR PO) Take by mouth. 05/20/21 05/19/22  [provider]  ethyl chloride spray PLEASE SEE ATTACHED FOR DETAILED DIRECTIONS 06/09/21   [provider]  famotidine (PEPCID) 40 MG tablet Take 1 tablet by mouth at bedtime. 06/14/17   [provider]  gabapentin  (NEURONTIN) 100 MG capsule Take by mouth. 12/10/19   [provider]  heparin 1000 unit/mL SOLN injection Heparin Sodium (Porcine) 1,000 Units/mL Systemic 02/09/21 02/08/22  [provider]  levothyroxine (SYNTHROID) 88 MCG tablet Take by mouth. 06/14/17   [provider]  lidocaine (XYLOCAINE) 5 % ointment APPLY A SMALL AMOUNT TO AFFECTED AREA TWICE DAILY AS NEEDED NECK, BACK OR JOINT PAIN 07/19/21   [provider]  linaclotide (LINZESS) 290 MCG CAPS capsule TAKE 1 CAPSULE BY MOUTH EVERY OTHER DAY AS NEEDED 01/09/18   [provider]  melatonin 3 MG TABS tablet TAKE ONE CAP/TAB BY MOUTH AT BEDTIME AS NEEDED FOR SLEEP 05/27/20   [provider]  Menthol-Methyl Salicylate ([place]) 0.5-15 % CREA APPLY A SMALL AMOUNT TO AFFECTED AREA FOUR TIMES A DAY NECK, BACK OR JOINT PAIN 07/19/21   [provider]  Methoxy PEG-Epoetin Beta (MIRCERA IJ) Mircera 04/06/21 04/05/22  [provider]  multivitamin (RENA-VIT) TABS tablet Take by mouth. 03/22/20   [provider]  omega-3 acid ethyl esters (LOVAZA) 1 g capsule Take by mouth.    [provider]  rosuvastatin (CRESTOR) 20 MG tablet Take 20 mg by mouth daily. 07/08/21   [provider]  senna-docusate (SENOKOT-S) 8.6-50 MG tablet TAKE 2 TABLETS BY MOUTH ONCE DAILY CONSTIPATION 07/19/21   [provider]  temazepam (RESTORIL) 30 MG capsule Take 30 mg  by mouth at bedtime as needed. 08/05/21   [provider]  VITAMIN D PO Take by mouth. 03/02/21 03/01/22  [provider]      Allergies    Penicillins, Pravastatin, and Simvastatin    Review of Systems   Review of Systems  Constitutional:  Negative for fever.  HENT:  Negative for tinnitus.   Eyes:  Negative for visual disturbance.  Respiratory:  Negative for shortness of breath.   Cardiovascular:  Negative for chest pain.  Gastrointestinal:  Negative for vomiting.  Neurological:  Positive for  dizziness. Negative for syncope, weakness and numbness.   Physical Exam Updated Vital Signs BP (!) 151/71    Pulse 63    Temp (!) 97.5 F (36.4 C)    Resp 12    Ht '5\' 9"'$  (1.753 m)    Wt 87 kg    SpO2 100%    BMI 28.32 kg/m  Physical Exam Vitals and nursing note reviewed.  Constitutional:      General: He is not in acute distress.    Appearance: He is well-developed. He is not ill-appearing or diaphoretic.  HENT:     Head: Normocephalic and atraumatic.  Eyes:     Extraocular Movements: Extraocular movements intact.     Pupils: Pupils are equal, round, and reactive to light.     Comments: Questionable right sided nystagmus, but if present is very subtle  Cardiovascular:     Rate and Rhythm: Normal rate and regular rhythm.     Heart sounds: Normal heart sounds.  Pulmonary:     Effort: Pulmonary effort is normal.     Breath sounds: Normal breath sounds.  Abdominal:     General: There is no distension.     Palpations: Abdomen is soft.     Tenderness: There is no abdominal tenderness.  Skin:    General: Skin is warm and dry.  Neurological:     Mental Status: He is alert.     Comments: CN 3-12 grossly intact. 5/5 strength in all 4 extremities. Grossly normal sensation. Normal finger to nose. He is able to walk on his own. Every so often he'll start to stumble and has to catch himself. States he feels like he's going to fall but can catch himself. No gross ataxia otherwise    ED Results / Procedures / Treatments   Labs (all labs ordered are listed, but only abnormal results are displayed) Labs Reviewed  BASIC METABOLIC PANEL - Abnormal; Notable for the following components:      Result Value   Glucose, Bld 131 (*)    BUN 57 (*)    Creatinine, Ser 9.67 (*)    GFR, Estimated 5 (*)    All other components within normal limits  CBC - Abnormal; Notable for the following components:   RBC 3.04 (*)    Hemoglobin 10.5 (*)    HCT 33.0 (*)    MCV 108.6 (*)    MCH 34.5 (*)    All  other components within normal limits  RESP PANEL BY RT-PCR (FLU A&B, COVID) ARPGX2  URINALYSIS, ROUTINE W REFLEX MICROSCOPIC  TSH  ETHANOL  CBG MONITORING, ED    EKG EKG Interpretation  Date/Time:  Monday February 20 2022 10:45:21 EST Ventricular Rate:  52 PR Interval:  312 QRS Duration: 116 QT Interval:  444 QTC Calculation: 412 R Axis:   -62 Text Interpretation: Sinus bradycardia prolonged PR interval, with intermittent dropped beats after P waves Left anterior fascicular block No  previous ECGs available Confirmed by Sherwood Gambler [phone]) on 02/20/2022 11:02:31 AM  Radiology CT ANGIO HEAD NECK W WO CM  Result Date: 02/20/2022 CLINICAL DATA:  Stroke/TIA, determine embolic source. Additional history provided: Dizziness. EXAM: CT ANGIOGRAPHY HEAD AND NECK TECHNIQUE: Multidetector CT imaging of the head and neck was performed using the standard protocol during bolus administration of intravenous contrast. Multiplanar CT image reconstructions and MIPs were obtained to evaluate the vascular anatomy. Carotid stenosis measurements (when applicable) are obtained utilizing NASCET criteria, using the distal internal carotid diameter as the denominator. RADIATION DOSE REDUCTION: This exam was performed according to the departmental dose-optimization program which includes automated exposure control, adjustment of the mA and/or kV according to patient size and/or use of iterative reconstruction technique. CONTRAST:  10m OMNIPAQUE IOHEXOL 350 MG/ML SOLN COMPARISON:  Brain MRI 02/20/2022.  Head CT 02/20/2022. FINDINGS: CTA NECK FINDINGS Aortic arch: Standard aortic branching. Atherosclerotic plaque within the visualized aortic arch and proximal major branch vessels of the neck. No hemodynamically significant innominate or proximal subclavian artery stenosis. Right carotid system: CCA and ICA patent within the neck. Prominent soft and calcified plaque about the carotid bifurcation and within the proximal ICA  as well as proximal ECA. Resultant stenosis at the origin of the right ICA of greater than 70%. Severe, near occlusive stenosis at the origin of the ECA. Left carotid system: CCA and ICA patent within the neck. Advanced soft and calcified plaque about the carotid bifurcation and within the proximal ICA. Resultant severe, near occlusive stenosis at the origin of the left ICA. Mild calcified plaque is also present at the CCA origin. Partially retropharyngeal course of the ICA. Vertebral arteries: Vertebral arteries codominant and patent within the neck. Nonstenotic calcified plaque at the origin of both vessels. Skeleton: Cervicothoracic levocurvature. Partially imaged thoracic dextrocurvature more inferiorly. Advanced cervical spondylosis. C7-T1 grade 1 anterolisthesis. No acute bony abnormality or aggressive osseous lesion. Other neck: No neck mass or cervical lymphadenopathy. Upper chest: No consolidation within the imaged lung apices. Review of the MIP images confirms the above findings CTA HEAD FINDINGS Anterior circulation: The intracranial internal carotid arteries are patent. Calcified plaque within both vessels. No significant stenosis of the intracranial right ICA. Mild-to-moderate narrowing of the paraclinoid left ICA. The M1 middle cerebral arteries are patent. No M2 proximal branch occlusion or high-grade proximal stenosis is identified. The anterior cerebral arteries are patent. Posterior circulation: The intracranial vertebral arteries are patent. Atherosclerotic plaque within both vessels with no more than mild stenosis. The basilar artery is patent. The posterior cerebral arteries are patent. Posterior communicating arteries are diminutive or absent bilaterally. Venous sinuses: Within the limitations of contrast timing, no convincing thrombus. Anatomic variants: As described. Review of the MIP images confirms the above findings IMPRESSION: CTA neck: 1. The common carotid and internal carotid arteries  are patent within the neck. However, there is prominent soft and calcified plaque about the carotid bifurcations and within the proximal ICAs, bilaterally. Resultant severe, near-occlusive stenosis at the origin of the left ICA (greater than 90%). There is also severe stenosis at the origin of the right ICA (greater than 70%). Additionally, there is severe, near-occlusive stenosis at the origin of the right external carotid artery. 2. Vertebral arteries codominant and patent within the neck without significant stenosis. 3.  Aortic Atherosclerosis (ICD10-I70.0). 4. Advanced cervical spondylosis. 5. Cervicothoracic spinal curvature as described. 6. C7-T1 grade 1 anterolisthesis. CTA head: 1. No intracranial large vessel occlusion or proximal high-grade arterial stenosis. 2. Atherosclerotic plaque within the  intracranial internal carotid arteries and vertebral arteries, with no more than mild stenosis at these sites. Electronically Signed   By: Kellie Simmering D.O.   On: 02/20/2022 16:01   CT HEAD WO CONTRAST  Result Date: 02/20/2022 CLINICAL DATA:  Dizziness EXAM: CT HEAD WITHOUT CONTRAST TECHNIQUE: Contiguous axial images were obtained from the base of the skull through the vertex without intravenous contrast. RADIATION DOSE REDUCTION: This exam was performed according to the departmental dose-optimization program which includes automated exposure control, adjustment of the mA and/or kV according to patient size and/or use of iterative reconstruction technique. COMPARISON:  None. FINDINGS: Brain: No acute intracranial findings are seen. There are no signs of bleeding. Cortical sulci are prominent. There is decreased density in periventricular white matter. Vascular: There are scattered arterial calcifications. Skull: No fracture is seen. Sinuses/Orbits: There is mild mucosal thickening in the ethmoid sinus. Postsurgical changes are noted in both optic globes, possibly previous cataract surgery. Other: None  IMPRESSION: No acute intracranial findings are seen in noncontrast CT brain. Atrophy. Small-vessel disease. Mild chronic sinusitis. Electronically Signed   By: Elmer Picker M.D.   On: 02/20/2022 11:04   MR BRAIN WO CONTRAST  Result Date: 02/20/2022 EXAM: MRI HEAD WITHOUT CONTRAST TECHNIQUE: Multiplanar, multiecho pulse sequences of the brain and surrounding structures were obtained without intravenous contrast. COMPARISON:  Same day CT head. FINDINGS: Brain: Punctate focus of apparent DWI hyperintensity in the high left parietal white matter (series 3, image 32) without definite ADC correlate. Mild-to-moderate scattered T2/FLAIR hyperintensities within the white matter, nonspecific but compatible with chronic microvascular ischemic disease. Cerebral atrophy with ex vacuo ventricular dilation. No evidence of acute hemorrhage, mass lesion, midline shift, or extra-axial fluid collection. Vascular: Abnormal flow void within the proximal left intradural vertebral artery. Otherwise, major arterial flow voids are maintained. Skull and upper cervical spine: Normal marrow signal. Sinuses/Orbits: Clear visualized sinuses.  Unremarkable orbits. Other: No mastoid effusions IMPRESSION: 1. Punctate focus of apparent DWI hyperintensity in the high left parietal white matter may represent a tiny acute/subacute infarct or artifact. 2. Abnormal flow void within the proximal left intradural vertebral artery, potentially representing significant stenosis or artifact. A CTA could further evaluate if clinically indicated. 3. Chronic microvascular disease and cerebral atrophy (ICD10-G31.9). Electronically Signed   By: Margaretha Sheffield M.D.   On: 02/20/2022 13:53    Procedures Procedures    Medications Ordered in ED Medications  meclizine (ANTIVERT) tablet 25 mg (25 mg Oral Given 02/20/22 1143)  iohexol (OMNIPAQUE) 350 MG/ML injection 75 mL (75 mLs Intravenous Contrast Given 02/20/22 1527)    ED Course/ Medical Decision  Making/ A&P                           Medical Decision Making Amount and/or Complexity of Data Reviewed Radiology: ordered.  Risk Prescription drug management. Decision regarding hospitalization.   Patient feels like he is not much better after the Antivert, still is dizzy.  CT head was obtained I personally reviewed these images and there is no obvious edema or hemorrhage or infarct.  However given his clinical signs/symptoms an MRI was obtained.  This shows a questionable infarct though I do not think this area would cause his symptoms now.  However there is concern for his vertebral artery flow and so a CTA will be obtained as well.  I discussed with neurology, Dr. Rory Percy, who agrees with CTA and given the questionable findings thinks he should come in for  a stroke work-up.  There is no known history of A-fib but the patient does have an irregular heart rate though clear P waves.  Labs reviewed/interpreted.  No old to compare to and he has chronic kidney disease but this goes along with his dialysis.  No other emergent cause of why he would be having the symptoms now.  Patient will need admission and he understands as well as daughter understands I have answered their questions at the bedside.  I discussed the case with the hospitalist, Dr. Rogers Blocker, who will admit.        Final Clinical Impression(s) / ED Diagnoses Final diagnoses:  Dizziness    Rx / DC Orders ED Discharge Orders     None         Sherwood Gambler, MD 02/20/22 1635

## 2022-02-20 NOTE — Assessment & Plan Note (Signed)
Will allow for permissive HTn in setting of possible acute CVA and hx of possible orthostasis  ? ?

## 2022-02-20 NOTE — Assessment & Plan Note (Signed)
Nephrology has been consulted.  ?No emergent indication for dialysis, due for it today though ?Will try to dialyze tonight or tomorrow  ?

## 2022-02-20 NOTE — ED Notes (Signed)
Advised pt that the doctor ordered urine however pt states due to his ESRD he produces little to no urine. ?

## 2022-02-20 NOTE — Progress Notes (Signed)
Carotid artery duplex completed. ?Refer to "CV Proc" under chart review to view preliminary results. ? ?02/20/2022 6:48 PM ?Kelby Aline., MHA, RVT, RDCS, RDMS   ?

## 2022-02-20 NOTE — Assessment & Plan Note (Signed)
TSH pending ?Continue synthroid ?

## 2022-02-20 NOTE — Consult Note (Signed)
Hospital Consult    Reason for Consult:  Left ICA stenosis Referring Physician:  Dr. Rogers Blocker MRN #:  [id number]  History of Present Illness: This is a 88 y.o. male without significant previous vascular history.  He does have end-stage renal disease currently dialyzes via left arm AV fistula.  For the past 4 days he has had significant dizziness particularly in the morning this has mostly resolved.  Today this did not resolve and he was sent to the emergency department after dialysis.  He currently feels better.  He does not currently complain of any weakness or numbness.  He does take aspirin every day does not take other blood thinners.  Past Medical History:  Diagnosis Date   Kidney disease     Past Surgical History:  Procedure Laterality Date   APPENDECTOMY     PROSTATE SURGERY      Allergies  Allergen Reactions   Penicillins Anaphylaxis, Rash and Other (See Comments)    Fainted like Fainted like    Pravastatin Other (See Comments)   Simvastatin Other (See Comments)    Prior to Admission medications   Medication Sig Start Date End Date Taking? Authorizing Provider  aspirin 81 MG EC tablet Take 81 mg by mouth daily. 07/22/13  Yes [provider]  b complex-C-folic acid 1 MG capsule Take 1 capsule by mouth at bedtime. 07/19/21  Yes [provider]  calcium acetate (PHOSLO) 667 MG capsule Take 1,334 mg by mouth 2 (two) times daily with a meal. 08/22/18 02/26/22 Yes [provider]  famotidine (PEPCID) 40 MG tablet Take 1 tablet by mouth at bedtime. 06/14/17  Yes [provider]  levothyroxine (SYNTHROID) 88 MCG tablet Take 88 mcg by mouth daily before breakfast. 06/14/17  Yes [provider]  melatonin 3 MG TABS tablet TAKE ONE CAP/TAB BY MOUTH AT BEDTIME AS NEEDED FOR SLEEP 05/27/20  Yes [provider]  rosuvastatin (CRESTOR) 20 MG tablet Take 20 mg by mouth daily. 07/08/21  Yes [provider]  senna-docusate (SENOKOT-S)  8.6-50 MG tablet TAKE 2 TABLETS BY MOUTH ONCE DAILY CONSTIPATION 07/19/21  Yes [provider]  Cinacalcet HCl (SENSIPAR PO) Take 1 capsule by mouth daily. 05/20/21 05/19/22  [provider]  ethyl chloride spray PLEASE SEE ATTACHED FOR DETAILED DIRECTIONS 06/09/21   [provider]  gabapentin (NEURONTIN) 100 MG capsule Take by mouth. 12/10/19   [provider]  heparin 1000 unit/mL SOLN injection Heparin Sodium ([place]cine) 1,000 Units/mL Systemic 02/09/21 02/08/22  [provider]  lidocaine (XYLOCAINE) 5 % ointment APPLY A SMALL AMOUNT TO AFFECTED AREA TWICE DAILY AS NEEDED NECK, BACK OR JOINT PAIN 07/19/21   [provider]  linaclotide (LINZESS) 290 MCG CAPS capsule TAKE 1 CAPSULE BY MOUTH EVERY OTHER DAY AS NEEDED 01/09/18   [provider]  Menthol-Methyl Salicylate ([place]) 0.5-15 % CREA APPLY A SMALL AMOUNT TO AFFECTED AREA FOUR TIMES A DAY NECK, BACK OR JOINT PAIN 07/19/21   [provider]  Methoxy PEG-Epoetin Beta (MIRCERA IJ) Mircera 04/06/21 04/05/22  [provider]  multivitamin (RENA-VIT) TABS tablet Take by mouth. 03/22/20   [provider]  omega-3 acid ethyl esters (LOVAZA) 1 g capsule Take by mouth.    [provider]  temazepam (RESTORIL) 30 MG capsule Take 30 mg by mouth at bedtime as needed. 08/05/21   [provider]  VITAMIN D PO Take by mouth. 03/02/21 03/01/22  [provider]    Social History   Socioeconomic History  Marital status: Unknown    Spouse name: Not on file   Number of children: Not on file   Years of education: Not on file   Highest education level: Not on file  Occupational History   Not on file  Tobacco Use   Smoking status: Former    Types: Cigarettes    Quit date: 60    Years since quitting: 51.2   Smokeless tobacco: Former  Substance and Sexual Activity   Alcohol use: Not Currently   Drug use: Never   Sexual activity: Not on file   Other Topics Concern   Not on file  Social History Narrative   Not on file   Social Determinants of Health   Financial Resource Strain: Not on file  Food Insecurity: Not on file  Transportation Needs: Not on file  Physical Activity: Not on file  Stress: Not on file  Social Connections: Not on file  Intimate Partner Violence: Not on file     Family History  Problem Relation Age of Onset   Cancer Mother    Heart failure Father     Review of Systems  Constitutional: Negative.   HENT: Negative.    Eyes: Negative.   Respiratory: Negative.    Cardiovascular: Negative.   Gastrointestinal: Negative.   Musculoskeletal: Negative.   Skin: Negative.   Neurological:  Positive for dizziness.  Endo/Heme/Allergies:  Bruises/bleeds easily.  Psychiatric/Behavioral: Negative.       Physical Examination  Vitals:   02/20/22 1500 02/20/22 1700  BP: (!) 151/71 (!) 145/79  Pulse: 63 72  Resp: 12 17  Temp:    SpO2: 100% 98%   Body mass index is 28.32 kg/m.  Physical Exam HENT:     Nose: Nose normal.  Eyes:     Pupils: Pupils are equal, round, and reactive to light.  Cardiovascular:     Rate and Rhythm: Normal rate.     Pulses:          Radial pulses are 2+ on the right side and 2+ on the left side.       Dorsalis pedis pulses are 2+ on the right side and 2+ on the left side.  Pulmonary:     Effort: Pulmonary effort is normal.  Abdominal:     General: Abdomen is flat.     Palpations: Abdomen is soft.  Musculoskeletal:     Comments: Strong left forearm thrill  Skin:    Capillary Refill: Capillary refill takes less than 2 seconds.  Neurological:     General: No focal deficit present.     Mental Status: He is alert.  Psychiatric:        Mood and Affect: Mood normal.        Behavior: Behavior normal.        Thought Content: Thought content normal.        Judgment: Judgment normal.     CBC    Component Value Date/Time   WBC 7.5 02/20/2022 1048   RBC 3.04 (L)  02/20/2022 1048   HGB 10.5 (L) 02/20/2022 1048   HCT 33.0 (L) 02/20/2022 1048   PLT 257 02/20/2022 1048   MCV 108.6 (H) 02/20/2022 1048   MCH 34.5 (H) 02/20/2022 1048   MCHC 31.8 02/20/2022 1048   RDW 14.4 02/20/2022 1048    BMET    Component Value Date/Time   NA 141 02/20/2022 1048   K 4.2 02/20/2022 1048   CL 102 02/20/2022 1048   CO2 26 02/20/2022 1048  GLUCOSE 131 (H) 02/20/2022 1048   BUN 57 (H) 02/20/2022 1048   CREATININE 9.67 (H) 02/20/2022 1048   CALCIUM 8.9 02/20/2022 1048   GFRNONAA 5 (L) 02/20/2022 1048    COAGS: No results found for: INR, PROTIME   Non-Invasive Vascular Imaging:    CT IMPRESSION: CTA neck:   1. The common carotid and internal carotid arteries are patent within the neck. However, there is prominent soft and calcified plaque about the carotid bifurcations and within the proximal ICAs, bilaterally. Resultant severe, near-occlusive stenosis at the origin of the left ICA (greater than 90%). There is also severe stenosis at the origin of the right ICA (greater than 70%). Additionally, there is severe, near-occlusive stenosis at the origin of the right external carotid artery. 2. Vertebral arteries codominant and patent within the neck without significant stenosis. 3.  Aortic Atherosclerosis (ICD10-I70.0). 4. Advanced cervical spondylosis. 5. Cervicothoracic spinal curvature as described. 6. C7-T1 grade 1 anterolisthesis.   CTA head:   1. No intracranial large vessel occlusion or proximal high-grade arterial stenosis. 2. Atherosclerotic plaque within the intracranial internal carotid arteries and vertebral arteries, with no more than mild stenosis at these sites.  MRI Brain IMPRESSION: 1. Punctate focus of apparent DWI hyperintensity in the high left parietal white matter may represent a tiny acute/subacute infarct or artifact. 2. Abnormal flow void within the proximal left intradural vertebral artery, potentially representing  significant stenosis or artifact. A CTA could further evaluate if clinically indicated. 3. Chronic microvascular disease and cerebral atrophy (ICD10-G31.9).   ASSESSMENT/PLAN: This is a 88 y.o. male with dizziness found to have possible symptomatic left ICA stenosis with high-grade stenosis of the left greater than the right with mixed soft and calcified plaque.  I discussed the findings with the patient and his granddaughter.  I will discuss with the inpatient neurologist tomorrow plans for medical therapy versus need for surgical intervention.  Doryce Mcgregory C. Donzetta Matters, MD [facility] Office: [phone] Pager: [phone]

## 2022-02-20 NOTE — Consult Note (Signed)
Neurology Consultation  Reason for Consult: Dizziness, high left parietal white matter punctate DWI hyperintensity Referring Physician: Dr. Regenia Skeeter  CC: Dizziness  History is obtained from: Patient, Chart review  HPI: Noah Osborn is a 88 y.o. male with a medical history significant for ESRD on HD MWF, anemia of CKD, essential hypertension, hyperlipidemia, hypothyroidism, chronic back pain, history of prostate cancer, type 2 diabetes mellitus who presented to the ED on 3/6 for evaluation of 4 days of dizziness. Patient states that he has had dizziness over the past 4 days that is worse in the mornings and typically resolves throughout the day but today the dizziness persisted. He states that on waking, the clock looks as if it is moving up and down and he feels that his gait is unsteady due to the dizziness. He states that the dizziness is worse with changing positions from laying to sitting and from sitting to standing. Patient most recently completed dialysis on 3/3 and was unable to attend dialysis today due to his dizziness and evaluation in the ED. While in the ED, patient had an MRI brain that revealed a punctate DWI hyperintensity in the high left parietal white matter concerning for possible infarction and neurology was consulted for further evaluation.  ROS: A complete ROS was performed and is negative except as noted in the HPI.   Past Medical History:  Diagnosis Date   Kidney disease    Past Surgical History:  Procedure Laterality Date   APPENDECTOMY     PROSTATE SURGERY     Family History  Problem Relation Age of Onset   Cancer Mother    Heart failure Father    Social History:   reports that he quit smoking about 51 years ago. His smoking use included cigarettes. He has quit using smokeless tobacco. He reports that he does not currently use alcohol. He reports that he does not use drugs.  Medications No current facility-administered medications for this  encounter.  Current Outpatient Medications:    aspirin 81 MG EC tablet, Take by mouth., Disp: , Rfl:    b complex-C-folic acid 1 MG capsule, Take 1 capsule by mouth at bedtime., Disp: , Rfl:    calcium acetate (PHOSLO) 667 MG capsule, TAKE TWO CAPSULES BY MOUTH TWICE DAILYAT LUNCH AND SUPPER, Disp: , Rfl:    calcium acetate (PHOSLO) 667 MG capsule, Take 1,334 mg by mouth 2 (two) times daily., Disp: , Rfl:    Cinacalcet HCl (SENSIPAR PO), Take by mouth., Disp: , Rfl:    ethyl chloride spray, PLEASE SEE ATTACHED FOR DETAILED DIRECTIONS, Disp: , Rfl:    famotidine (PEPCID) 40 MG tablet, Take 1 tablet by mouth at bedtime., Disp: , Rfl:    gabapentin (NEURONTIN) 100 MG capsule, Take by mouth., Disp: , Rfl:    heparin 1000 unit/mL SOLN injection, Heparin Sodium (Porcine) 1,000 Units/mL Systemic, Disp: , Rfl:    levothyroxine (SYNTHROID) 88 MCG tablet, Take by mouth., Disp: , Rfl:    lidocaine (XYLOCAINE) 5 % ointment, APPLY A SMALL AMOUNT TO AFFECTED AREA TWICE DAILY AS NEEDED NECK, BACK OR JOINT PAIN, Disp: , Rfl:    linaclotide (LINZESS) 290 MCG CAPS capsule, TAKE 1 CAPSULE BY MOUTH EVERY OTHER DAY AS NEEDED, Disp: , Rfl:    melatonin 3 MG TABS tablet, TAKE ONE CAP/TAB BY MOUTH AT BEDTIME AS NEEDED FOR SLEEP, Disp: , Rfl:    Menthol-Methyl Salicylate (THERA-GESIC) 0.5-15 % CREA, APPLY A SMALL AMOUNT TO AFFECTED AREA FOUR TIMES A DAY NECK,  BACK OR JOINT PAIN, Disp: , Rfl:    Methoxy PEG-Epoetin Beta (MIRCERA IJ), Mircera, Disp: , Rfl:    multivitamin (RENA-VIT) TABS tablet, Take by mouth., Disp: , Rfl:    omega-3 acid ethyl esters (LOVAZA) 1 g capsule, Take by mouth., Disp: , Rfl:    rosuvastatin (CRESTOR) 20 MG tablet, Take 20 mg by mouth daily., Disp: , Rfl:    senna-docusate (SENOKOT-S) 8.6-50 MG tablet, TAKE 2 TABLETS BY MOUTH ONCE DAILY CONSTIPATION, Disp: , Rfl:    temazepam (RESTORIL) 30 MG capsule, Take 30 mg by mouth at bedtime as needed., Disp: , Rfl:    VITAMIN D PO, Take by mouth.,  Disp: , Rfl:   Exam: Current vital signs: BP (!) 151/71    Pulse 63    Temp (!) 97.5 F (36.4 C)    Resp 12    Ht '5\' 9"'$  (1.753 m)    Wt 87 kg    SpO2 100%    BMI 28.32 kg/m  Vital signs in last 24 hours: Temp:  [97.5 F (36.4 C)] 97.5 F (36.4 C) (03/06 1037) Pulse Rate:  [63-78] 63 (03/06 1500) Resp:  [12-24] 12 (03/06 1500) BP: (138-174)/(64-85) 151/71 (03/06 1500) SpO2:  [94 %-100 %] 100 % (03/06 1500) Weight:  [87 kg] 87 kg (03/06 1038)  GENERAL: Awake, alert, in no acute distress Psych: Affect appropriate for situation, patient is calm and cooperative with examination Head: Normocephalic and atraumatic, without obvious abnormality EENT: Normal conjunctivae, dry mucous membranes, no OP obstruction. Patient is hard of hearing at baseline.  LUNGS: Normal respiratory effort. Non-labored breathing on room air CV: Intermittent bradycardia on telemetry with regular rhythm and PACs ABDOMEN: Soft, non-tender, non-distended Extremities: Warm, well perfused, without obvious deformity  NEURO:  Mental Status: Awake, alert, and oriented to person, place, time, and situation. He is able to provide a clear and coherent history of present illness. Speech/Language: speech is intact without dysarthria.    Naming, repetition, fluency, and comprehension intact without aphasia. No neglect is noted Cranial Nerves:  II: PERRL. Visual fields full.  III, IV, VI: EOMI without ptosis. Patient does endorse dizziness with lateral gaze but there is no nystagmus noted on examination.  V: Sensation is intact to light touch and symmetrical to face.  VII: Face is symmetric resting and smiling.  VIII: Hearing is intact to loud voice; patient is hard of hearing at baseline IX, X: Palate elevation is symmetric. Phonation normal.  XI: Normal sternocleidomastoid and trapezius muscle strength XII: Tongue protrudes midline without fasciculations.   Motor: 5/5 strength is all muscle groups without vertical drift  or asymmetry Tone is normal. Bulk is normal.  Sensation: Intact to light touch bilaterally in all four extremities. No extinction to DSS present.  Coordination: FTN intact bilaterally. HKS intact bilaterally. No pronator drift.  Gait: Deferred for patient's safety  NIHSS: 0  Labs I have reviewed labs in epic and the results pertinent to this consultation are: CBC    Component Value Date/Time   WBC 7.5 02/20/2022 1048   RBC 3.04 (L) 02/20/2022 1048   HGB 10.5 (L) 02/20/2022 1048   HCT 33.0 (L) 02/20/2022 1048   PLT 257 02/20/2022 1048   MCV 108.6 (H) 02/20/2022 1048   MCH 34.5 (H) 02/20/2022 1048   MCHC 31.8 02/20/2022 1048   RDW 14.4 02/20/2022 1048   CMP     Component Value Date/Time   NA 141 02/20/2022 1048   K 4.2 02/20/2022 1048   CL 102  02/20/2022 1048   CO2 26 02/20/2022 1048   GLUCOSE 131 (H) 02/20/2022 1048   BUN 57 (H) 02/20/2022 1048   CREATININE 9.67 (H) 02/20/2022 1048   CALCIUM 8.9 02/20/2022 1048   GFRNONAA 5 (L) 02/20/2022 1048   Lipid Panel  No results found for: CHOL, TRIG, HDL, CHOLHDL, VLDL, LDLCALC, LDLDIRECT No results found for: HGBA1C  Imaging I have reviewed the images obtained:  CT-scan of the brain 3/6: No acute intracranial findings are seen in noncontrast CT brain. Atrophy. Small-vessel disease. Mild chronic sinusitis.  MRI examination of the brain 3/6: 1. Punctate focus of apparent DWI hyperintensity in the high left parietal white matter may represent a tiny acute/subacute infarct or artifact. 2. Abnormal flow void within the proximal left intradural vertebral artery, potentially representing significant stenosis or artifact. A CTA could further evaluate if clinically indicated. 3. Chronic microvascular disease and cerebral atrophy (ICD10-G31.9).  CTA head and neck 3/6: CTA neck: 1. The common carotid and internal carotid arteries are patent within the neck. However, there is prominent soft and calcified plaque about the carotid  bifurcations and within the proximal ICAs, bilaterally. Resultant severe, near-occlusive stenosis at the origin of the left ICA (greater than 90%). There is also severe stenosis at the origin of the right ICA (greater than 70%). Additionally, there is severe, near-occlusive stenosis at the origin of the right external carotid artery. 2. Vertebral arteries codominant and patent within the neck without significant stenosis. 3.  Aortic Atherosclerosis (ICD10-I70.0). 4. Advanced cervical spondylosis. 5. Cervicothoracic spinal curvature as described. 6. C7-T1 grade 1 anterolisthesis.   CTA head: 1. No intracranial large vessel occlusion or proximal high-grade arterial stenosis. 2. Atherosclerotic plaque within the intracranial internal carotid arteries and vertebral arteries, with no more than mild stenosis at these sites.  Assessment: 88 y.o. male who presented to the ED 3/6 for evaluation of 4 days of propgressive dizziness worse in the mornings that typically resolves throughout the day. Patient's dizziness was persistent this morning prompting him to come to the ED for further evaluation.  - Examination is without focal neurologic deficits with an NIHSS of 0. - MRI brain with high parietal white matter DWI hyperintensity concerning for a punctate acute/subacute infarction.  - CTA with severe near-occlusive stenosis of the left ICA > 90% and severe stenosis at the origin of the right ICA > 70% with near-occlusive stenosis at the origin of the right external carotid artery.  - Due to cortical location of imaging hyperintensity, patient recommended for admission and stroke work up with concern for possible embolic etiology of punctate stroke. It is likely that the DWI hyperintensity is an incidental finding and unlikely the cause of patient's presentation. It is possible that patient's cerebral hypoperfusion may be contributing to his presentation with severe bilateral ICA stenosis and complaints of  dizziness with position changes.  - Stroke risk factors include patient's advanced age, ESRD, HTN, HLD, and remote history of tobacco use.   Recommendations: - HgbA1c, fasting lipid panel - Frequent neuro checks - Echocardiogram - Prophylactic therapy- Antiplatelet med: Aspirin 81 mg PO daily for now. Decision on DAPT after vascular consult. - Risk factor modification - Orthostatic vital signs - Telemetry monitoring - PT consult, OT consult, Speech consult - Stroke team to follow, Vascular consult recommended.  Anibal Henderson, AGAC-NP [facility] Pager: [phone]   Attending Neurohospitalist Addendum Patient seen and examined with APP/Resident. Agree with the history and physical as documented above. Agree with the plan as documented, which  I helped formulate. I have independently reviewed the chart, obtained history, review of systems and examined the patient.I have personally reviewed pertinent head/neck/spine imaging (CT/MRI).  Punctate high frontal stroke. Less likely the cause of presentation but given ongoing symptoms of gait issues as well as dizziness, would benefit from stroke risk factor work up given imaging did show a stroke. Has risk factors incl advanced age. On ASA at home - continue for now. May need DAPT as the CTA shows significant stenosis of ICAs bilaterally. D/W Dr Rogers Blocker at bedside. Please feel free to call with any questions.   -- Amie Portland, MD Neurologist [facility] Pager: [phone]

## 2022-02-20 NOTE — H&P (Addendum)
History and Physical    Patient: Noah Osborn UMP:[id number] DOB: 1924-02-15 DOA: 02/20/2022 DOS: the patient was seen and examined on 02/20/2022 PCP: Nicoletta Dress, MD  Patient coming from: Home - lives alone. Grandson stays with  him at night    Chief Complaint: dizziness  HPI: Noah Osborn is a 88 y.o. male with medical history significant of ESRD on dialysis MWF, anemia of CKD, HLD, HTN, hypothyroidism, chronic back pain, hx of prostate cancer, T2DM  presenting to ED with intermittent dizziness that was persistent today and prompted him to come to ED. He started to feel dizzy on Thursday. He felt dizzy with standing and then the dizziness resolved. Friday at dialysis he was dizzy again in the morning with getting out of the bed and then resolved. Over the weekend the dizziness seemed to persist and was worse today. He feels like the world is spinning and he is standing still. He is mildly dizzy right now. Denies tinnitus. Is hard of hearing.   He denies any fever/chills, no headaches, no vision changes, chest pain or palpitations, shortness of breath or cough, no stomach pain, No N/V/D, does not make urine, no leg swelling.   ER Course:  vitals: afebrile, bp: 168/75, HR: 66, RR: 18, oxygen 99%RA Pertinent labs: hgb: 10.5, mcv: 108, BUN: 57, creatinine: 9.67,  CTH: no acute finding  MRI brain: punctate focus of apparent DWI hyperintensity in the high left parietal white matter may represent a tiny/subacute infarct or artifact. Abnormal flow w/in the proximal left intradural vertebral artery, ? Stenosis/artifact.  In ED: neurology consulted. CTA head/neck ordered.    Review of Systems: As mentioned in the history of present illness. All other systems reviewed and are negative. Past Medical History:  Diagnosis Date   Kidney disease    Past Surgical History:  Procedure Laterality Date   APPENDECTOMY     PROSTATE SURGERY     Social History:  reports that he quit smoking about  51 years ago. His smoking use included cigarettes. He has quit using smokeless tobacco. He reports that he does not currently use alcohol. He reports that he does not use drugs.  Allergies  Allergen Reactions   Penicillins Anaphylaxis, Rash and Other (See Comments)    Fainted like Fainted like    Pravastatin Other (See Comments)   Simvastatin Other (See Comments)    Family History  Problem Relation Age of Onset   Cancer Mother    Heart failure Father     Prior to Admission medications   Medication Sig Start Date End Date Taking? Authorizing Provider  aspirin 81 MG EC tablet Take by mouth. 07/22/13   [provider]  b complex-C-folic acid 1 MG capsule Take 1 capsule by mouth at bedtime. 07/19/21   [provider]  calcium acetate (PHOSLO) 667 MG capsule TAKE TWO CAPSULES BY MOUTH TWICE DAILYAT LUNCH AND SUPPER 08/22/18 02/26/22  [provider]  calcium acetate (PHOSLO) 667 MG capsule Take 1,334 mg by mouth 2 (two) times daily. 02/25/21   [provider]  Cinacalcet HCl (SENSIPAR PO) Take by mouth. 05/20/21 05/19/22  [provider]  ethyl chloride spray PLEASE SEE ATTACHED FOR DETAILED DIRECTIONS 06/09/21   [provider]  famotidine (PEPCID) 40 MG tablet Take 1 tablet by mouth at bedtime. 06/14/17   [provider]  gabapentin (NEURONTIN) 100 MG capsule Take by mouth. 12/10/19   [provider]  heparin 1000 unit/mL SOLN injection Heparin Sodium (Porcine) 1,000  Units/mL Systemic 02/09/21 02/08/22  [provider]  levothyroxine (SYNTHROID) 88 MCG tablet Take by mouth. 06/14/17   [provider]  lidocaine (XYLOCAINE) 5 % ointment APPLY A SMALL AMOUNT TO AFFECTED AREA TWICE DAILY AS NEEDED NECK, BACK OR JOINT PAIN 07/19/21   [provider]  linaclotide (LINZESS) 290 MCG CAPS capsule TAKE 1 CAPSULE BY MOUTH EVERY OTHER DAY AS NEEDED 01/09/18   [provider]  melatonin 3 MG TABS tablet TAKE ONE  CAP/TAB BY MOUTH AT BEDTIME AS NEEDED FOR SLEEP 05/27/20   [provider]  Menthol-Methyl Salicylate (THERA-GESIC) 0.5-15 % CREA APPLY A SMALL AMOUNT TO AFFECTED AREA FOUR TIMES A DAY NECK, BACK OR JOINT PAIN 07/19/21   [provider]  Methoxy PEG-Epoetin Beta (MIRCERA IJ) Mircera 04/06/21 04/05/22  [provider]  multivitamin (RENA-VIT) TABS tablet Take by mouth. 03/22/20   [provider]  omega-3 acid ethyl esters (LOVAZA) 1 g capsule Take by mouth.    [provider]  rosuvastatin (CRESTOR) 20 MG tablet Take 20 mg by mouth daily. 07/08/21   [provider]  senna-docusate (SENOKOT-S) 8.6-50 MG tablet TAKE 2 TABLETS BY MOUTH ONCE DAILY CONSTIPATION 07/19/21   [provider]  temazepam (RESTORIL) 30 MG capsule Take 30 mg by mouth at bedtime as needed. 08/05/21   [provider]  VITAMIN D PO Take by mouth. 03/02/21 03/01/22  [provider]    Physical Exam: Vitals:   02/20/22 1403 02/20/22 1430 02/20/22 1500 02/20/22 1700  BP: 138/76 (!) 155/80 (!) 151/71 (!) 145/79  Pulse: 65 70 63 72  Resp: '15 14 12 17  '$ Temp:      SpO2: 94% 98% 100% 98%  Weight:      Height:       General:  Appears calm and comfortable and is in NAD Eyes:  PERRL, EOMI, normal lids, iris ENT:  hard of hearing, lips & tongue, mmm; appropriate dentition Neck:  no LAD, masses or thyromegaly; +carotid bruits, but could be from systolic murmur  Cardiovascular:  RRR, harsh systolic murmur. No LE edema.  Respiratory:   CTA bilaterally with no wheezes/rales/rhonchi.  Normal respiratory effort. Abdomen:  soft, NT, ND, NABS Back:   normal alignment, no CVAT Skin:  no rash or induration seen on limited exam. Fistula in right forearm  Musculoskeletal:  grossly normal tone BUE/BLE, good ROM, no bony abnormality Lower extremity:  No LE edema.  Limited foot exam with no ulcerations.  2+ distal pulses. Psychiatric:  grossly normal mood and affect, speech  fluent and appropriate, AOx3 Neurologic:  CN 2-12 grossly intact, moves all extremities in coordinated fashion, sensation intact. No nystagmus. FTN intact bilaterally. HTK intact bilaterally. Negative pronator drift. Gait deferred. Cnt dix halpike.    Radiological Exams on Admission: Independently reviewed - see discussion in A/P where applicable  CT ANGIO HEAD NECK W WO CM  Result Date: 02/20/2022 CLINICAL DATA:  Stroke/TIA, determine embolic source. Additional history provided: Dizziness. EXAM: CT ANGIOGRAPHY HEAD AND NECK TECHNIQUE: Multidetector CT imaging of the head and neck was performed using the standard protocol during bolus administration of intravenous contrast. Multiplanar CT image reconstructions and MIPs were obtained to evaluate the vascular anatomy. Carotid stenosis measurements (when applicable) are obtained utilizing NASCET criteria, using the distal internal carotid diameter as the denominator. RADIATION DOSE REDUCTION: This exam was performed according to the departmental dose-optimization program which includes automated exposure control, adjustment of the mA and/or kV according to patient size and/or use  of iterative reconstruction technique. CONTRAST:  48m OMNIPAQUE IOHEXOL 350 MG/ML SOLN COMPARISON:  Brain MRI 02/20/2022.  Head CT 02/20/2022. FINDINGS: CTA NECK FINDINGS Aortic arch: Standard aortic branching. Atherosclerotic plaque within the visualized aortic arch and proximal major branch vessels of the neck. No hemodynamically significant innominate or proximal subclavian artery stenosis. Right carotid system: CCA and ICA patent within the neck. Prominent soft and calcified plaque about the carotid bifurcation and within the proximal ICA as well as proximal ECA. Resultant stenosis at the origin of the right ICA of greater than 70%. Severe, near occlusive stenosis at the origin of the ECA. Left carotid system: CCA and ICA patent within the neck. Advanced soft and calcified plaque  about the carotid bifurcation and within the proximal ICA. Resultant severe, near occlusive stenosis at the origin of the left ICA. Mild calcified plaque is also present at the CCA origin. Partially retropharyngeal course of the ICA. Vertebral arteries: Vertebral arteries codominant and patent within the neck. Nonstenotic calcified plaque at the origin of both vessels. Skeleton: Cervicothoracic levocurvature. Partially imaged thoracic dextrocurvature more inferiorly. Advanced cervical spondylosis. C7-T1 grade 1 anterolisthesis. No acute bony abnormality or aggressive osseous lesion. Other neck: No neck mass or cervical lymphadenopathy. Upper chest: No consolidation within the imaged lung apices. Review of the MIP images confirms the above findings CTA HEAD FINDINGS Anterior circulation: The intracranial internal carotid arteries are patent. Calcified plaque within both vessels. No significant stenosis of the intracranial right ICA. Mild-to-moderate narrowing of the paraclinoid left ICA. The M1 middle cerebral arteries are patent. No M2 proximal branch occlusion or high-grade proximal stenosis is identified. The anterior cerebral arteries are patent. Posterior circulation: The intracranial vertebral arteries are patent. Atherosclerotic plaque within both vessels with no more than mild stenosis. The basilar artery is patent. The posterior cerebral arteries are patent. Posterior communicating arteries are diminutive or absent bilaterally. Venous sinuses: Within the limitations of contrast timing, no convincing thrombus. Anatomic variants: As described. Review of the MIP images confirms the above findings IMPRESSION: CTA neck: 1. The common carotid and internal carotid arteries are patent within the neck. However, there is prominent soft and calcified plaque about the carotid bifurcations and within the proximal ICAs, bilaterally. Resultant severe, near-occlusive stenosis at the origin of the left ICA (greater than  90%). There is also severe stenosis at the origin of the right ICA (greater than 70%). Additionally, there is severe, near-occlusive stenosis at the origin of the right external carotid artery. 2. Vertebral arteries codominant and patent within the neck without significant stenosis. 3.  Aortic Atherosclerosis (ICD10-I70.0). 4. Advanced cervical spondylosis. 5. Cervicothoracic spinal curvature as described. 6. C7-T1 grade 1 anterolisthesis. CTA head: 1. No intracranial large vessel occlusion or proximal high-grade arterial stenosis. 2. Atherosclerotic plaque within the intracranial internal carotid arteries and vertebral arteries, with no more than mild stenosis at these sites. Electronically Signed   By: KKellie SimmeringD.O.   On: 02/20/2022 16:01   CT HEAD WO CONTRAST  Result Date: 02/20/2022 CLINICAL DATA:  Dizziness EXAM: CT HEAD WITHOUT CONTRAST TECHNIQUE: Contiguous axial images were obtained from the base of the skull through the vertex without intravenous contrast. RADIATION DOSE REDUCTION: This exam was performed according to the departmental dose-optimization program which includes automated exposure control, adjustment of the mA and/or kV according to patient size and/or use of iterative reconstruction technique. COMPARISON:  None. FINDINGS: Brain: No acute intracranial findings are seen. There are no signs of bleeding. Cortical sulci are prominent. There is decreased  density in periventricular white matter. Vascular: There are scattered arterial calcifications. Skull: No fracture is seen. Sinuses/Orbits: There is mild mucosal thickening in the ethmoid sinus. Postsurgical changes are noted in both optic globes, possibly previous cataract surgery. Other: None IMPRESSION: No acute intracranial findings are seen in noncontrast CT brain. Atrophy. Small-vessel disease. Mild chronic sinusitis. Electronically Signed   By: Elmer Picker M.D.   On: 02/20/2022 11:04   MR BRAIN WO CONTRAST  Result Date:  02/20/2022 EXAM: MRI HEAD WITHOUT CONTRAST TECHNIQUE: Multiplanar, multiecho pulse sequences of the brain and surrounding structures were obtained without intravenous contrast. COMPARISON:  Same day CT head. FINDINGS: Brain: Punctate focus of apparent DWI hyperintensity in the high left parietal white matter (series 3, image 32) without definite ADC correlate. Mild-to-moderate scattered T2/FLAIR hyperintensities within the white matter, nonspecific but compatible with chronic microvascular ischemic disease. Cerebral atrophy with ex vacuo ventricular dilation. No evidence of acute hemorrhage, mass lesion, midline shift, or extra-axial fluid collection. Vascular: Abnormal flow void within the proximal left intradural vertebral artery. Otherwise, major arterial flow voids are maintained. Skull and upper cervical spine: Normal marrow signal. Sinuses/Orbits: Clear visualized sinuses.  Unremarkable orbits. Other: No mastoid effusions IMPRESSION: 1. Punctate focus of apparent DWI hyperintensity in the high left parietal white matter may represent a tiny acute/subacute infarct or artifact. 2. Abnormal flow void within the proximal left intradural vertebral artery, potentially representing significant stenosis or artifact. A CTA could further evaluate if clinically indicated. 3. Chronic microvascular disease and cerebral atrophy (ICD10-G31.9). Electronically Signed   By: Margaretha Sheffield M.D.   On: 02/20/2022 13:53   VAS US CAROTID  Result Date: 02/20/2022 Carotid Arterial Duplex Study Patient Name:  DAKSH COATES  Date of Exam:   02/20/2022 Medical Rec #: [id number]        Accession #:    [id number] Date of Birth: March 07, 1924       Patient Gender: M Patient Age:   57 years Exam Location:  [facility] Procedure:      VAS US CAROTID Referring Phys: Orma Flaming --------------------------------------------------------------------------------  Indications:       Carotid artery disease and Dizziness. Limitations         Today's exam was limited due to the patient's respiratory                    variation. Comparison Study:  No prior study Performing Technologist: Maudry Mayhew MHA, RDMS, RVT, RDCS  Examination Guidelines: A complete evaluation includes B-mode imaging, spectral Doppler, color Doppler, and power Doppler as needed of all accessible portions of each vessel. Bilateral testing is considered an integral part of a complete examination. Limited examinations for reoccurring indications may be performed as noted.  Right Carotid Findings: +----------+--------+--------+--------+------------------+---------+             PSV cm/s EDV cm/s Stenosis Plaque Description Comments   +----------+--------+--------+--------+------------------+---------+  CCA Prox   76       11                                              +----------+--------+--------+--------+------------------+---------+  CCA Distal 71       12                calcific           Shadowing  +----------+--------+--------+--------+------------------+---------+  ICA Prox  257      60       40-59%   calcific           Shadowing  +----------+--------+--------+--------+------------------+---------+  ICA Distal 97       13                                              +----------+--------+--------+--------+------------------+---------+  ECA        190                        calcific           shadowing  +----------+--------+--------+--------+------------------+---------+ +----------+--------+-------+----------------+-------------------+             PSV cm/s EDV cms Describe         Arm Pressure (mmHG)  +----------+--------+-------+----------------+-------------------+  Subclavian 210              Multiphasic, WNL                      +----------+--------+-------+----------------+-------------------+ +---------+--------+--+--------+--+---------+  Vertebral PSV cm/s 70 EDV cm/s 15 Antegrade  +---------+--------+--+--------+--+---------+  Left Carotid Findings:  +----------+--------+--------+--------+-------------------------+---------+             PSV cm/s EDV cm/s Stenosis Plaque Description        Comments   +----------+--------+--------+--------+-------------------------+---------+  CCA Prox   78       7                                                      +----------+--------+--------+--------+-------------------------+---------+  CCA Distal 125      13                                                     +----------+--------+--------+--------+-------------------------+---------+  ICA Prox   176      31       40-59%   calcific and heterogenous Shadowing  +----------+--------+--------+--------+-------------------------+---------+  ICA Distal 109      22                                                     +----------+--------+--------+--------+-------------------------+---------+  ECA        96                         calcific                  shadowing  +----------+--------+--------+--------+-------------------------+---------+ +----------+--------+--------+---------------------+-------------------+             PSV cm/s EDV cm/s Describe              Arm Pressure (mmHG)  +----------+--------+--------+---------------------+-------------------+  Subclavian 281               Monophasic, turbulent                      +----------+--------+--------+---------------------+-------------------+ +---------+--------+--+--------+--+---------+  Vertebral PSV cm/s 33 EDV cm/s 10 Antegrade  +---------+--------+--+--------+--+---------+   Summary: Right Carotid: Velocities in the right ICA are consistent with an upper range                40-59% stenosis. Left Carotid: Velocities in the left ICA are consistent with a 40-59% stenosis               by peak systolic velocity, upper range 1-39% stenosis by end               diastolic velocity. Vertebrals:  Bilateral vertebral arteries demonstrate antegrade flow. Subclavians: Left subclavian artery flow was disturbed. Normal flow  hemodynamics              were seen in the right subclavian artery. *See table(s) above for measurements and observations.  Electronically signed by Servando Snare MD on 02/20/2022 at 7:06:59 PM.    Final     EKG: Independently reviewed.  NSR with rate 76 with PAC; nonspecific ST changes with no evidence of acute ischemia No previous ekg   Labs on Admission: I have personally reviewed the available labs and imaging studies at the time of the admission.  Pertinent labs:   hgb: 10.5,  mcv: 108,  BUN: 57,  creatinine: 9.67,    Assessment and Plan: * Dizziness 88 year old male presenting with worsening dizziness found to have punctate tiny acute/subacute infarct in left parietal white matter concerning for embolic source. Also has harsh systolic murmur and hx consistent with orthostasis as well as carotid artery disease.   -observation to telemetry -orthostatic vitals today and tomorrow-place TED hose  -neurology consulted -Neurochecks per protocol -Neurology consulted -echo: check for AS as well  -fasting lipid panel/A1C -Continue daily aspirin 81 mg and plavix per neurology recs.  -Permissive hypertension first 24 hours <220/110 -PT/OT with dizziness  -carotid w/u see #2 -meclizine prn    Bilateral carotid artery stenosis CTA shows severe near-occlusive stenosis at the origin of the left ICA >90% and severe stenosis at the origin of the right ICA, >70%. Also severe, near occlusive stenosis at the origin of the right external carotid artery.  -have consulted vascular-Dr. Donzetta Matters who will see him tonight or tomorrow  -check carotid US  -continue ASA/statin   End stage renal disease [facility]) Nephrology has been consulted.  No emergent indication for dialysis, due for it today though Will try to dialyze tonight or tomorrow   Hypertension-resolved as of 02/20/2022 Will allow for permissive HTn in setting of possible acute CVA and hx of possible orthostasis    Hypothyroidism,  unspecified TSH pending Continue synthroid  Hyperlipidemia Continue crestor '20mg'$ , goal LDL <70 Fasting lipid panel for AM   Anemia in chronic kidney disease Do not have baseline labs to see what he normally runs and can not find in [place] notes.  -will check b12/folate with macrocytosis  Continue to monitor   Chronic low back pain On low dose gabapentin could contribute to dizziness. Will hold for now  Lidocaine patch prn     Advance Care Planning:   Code Status: Full Code   Consults: nephrology: Dr. Jonnie Finner and neurology: Dr. Rory Percy, vascular surgery-Dr. Donzetta Matters  DVT Prophylaxis: heparin   Family Communication: daughter in law at bedside.   Severity of Illness: The appropriate patient status for this patient is OBSERVATION. Observation status is judged to be reasonable and necessary in order to provide the required intensity of service to ensure the patient's safety. The patient's presenting  symptoms, physical exam findings, and initial radiographic and laboratory data in the context of their medical condition is felt to place them at decreased risk for further clinical deterioration. Furthermore, it is anticipated that the patient will be medically stable for discharge from the hospital within 2 midnights of admission.   Author: Orma Flaming, MD 02/20/2022 7:27 PM  For on call review [email or web address].

## 2022-02-20 NOTE — Assessment & Plan Note (Addendum)
On low dose gabapentin could contribute to dizziness. Will hold for now  ?Lidocaine patch prn  ?

## 2022-02-20 NOTE — ED Notes (Signed)
Patient transported to MRI 

## 2022-02-20 NOTE — Consult Note (Signed)
[facility] Renal Consultation Note    Indication for Consultation:  Management of ESRD/hemodialysis; anemia, hypertension/volume and secondary hyperparathyroidism  JHE:RDEYCXK, Lora Havens, MD  HPI: Noah Osborn is a 88 y.o. male with ESRD on HD MWF at [facility]. His past medical history is significant for anemia of CKD, essential hypertension, hyperlipidemia, hypothyroidism, chronic back pain, history of prostate cancer (s/p prostatectomy 2004), and type 2 diabetes mellitus.  Patient presented to the ED c/o dizziness and leg weakness. Seen and examined patient at bedside. He reports his leg weakness started last Thursday 3/3 and worsened over the weekend. He last received full HD treatment on 02/17/22. Patient currently on RA. Denies SOB and CP. Current labs and VS are stable. Neuro on board for CVA work-up. Due to high patient census on HD unit, plan for HD tomorrow AM-1st shift.  Past Medical History:  Diagnosis Date   Kidney disease    Past Surgical History:  Procedure Laterality Date   APPENDECTOMY     PROSTATE SURGERY     Family History  Problem Relation Age of Onset   Cancer Mother    Heart failure Father    Social History:  reports that he quit smoking about 51 years ago. His smoking use included cigarettes. He has quit using smokeless tobacco. He reports that he does not currently use alcohol. He reports that he does not use drugs. Allergies  Allergen Reactions   Penicillins Anaphylaxis, Rash and Other (See Comments)    Fainted like Fainted like    Pravastatin Other (See Comments)   Simvastatin Other (See Comments)   Prior to Admission medications   Medication Sig Start Date End Date Taking? Authorizing Provider  aspirin 81 MG EC tablet Take 81 mg by mouth daily. 07/22/13  Yes [provider]  b complex-C-folic acid 1 MG capsule Take 1 capsule by mouth at bedtime. 07/19/21  Yes [provider]  calcium acetate (PHOSLO) 667 MG  capsule Take 1,334 mg by mouth 2 (two) times daily with a meal. 08/22/18 02/26/22 Yes [provider]  famotidine (PEPCID) 40 MG tablet Take 1 tablet by mouth at bedtime. 06/14/17  Yes [provider]  levothyroxine (SYNTHROID) 88 MCG tablet Take 88 mcg by mouth daily before breakfast. 06/14/17  Yes [provider]  melatonin 3 MG TABS tablet TAKE ONE CAP/TAB BY MOUTH AT BEDTIME AS NEEDED FOR SLEEP 05/27/20  Yes [provider]  rosuvastatin (CRESTOR) 20 MG tablet Take 20 mg by mouth daily. 07/08/21  Yes [provider]  senna-docusate (SENOKOT-S) 8.6-50 MG tablet TAKE 2 TABLETS BY MOUTH ONCE DAILY CONSTIPATION 07/19/21  Yes [provider]  Cinacalcet HCl (SENSIPAR PO) Take 1 capsule by mouth daily. 05/20/21 05/19/22  [provider]  ethyl chloride spray PLEASE SEE ATTACHED FOR DETAILED DIRECTIONS 06/09/21   [provider]  gabapentin (NEURONTIN) 100 MG capsule Take by mouth. 12/10/19   [provider]  heparin 1000 unit/mL SOLN injection Heparin Sodium (Porcine) 1,000 Units/mL Systemic 02/09/21 02/08/22  [provider]  lidocaine (XYLOCAINE) 5 % ointment APPLY A SMALL AMOUNT TO AFFECTED AREA TWICE DAILY AS NEEDED NECK, BACK OR JOINT PAIN 07/19/21   [provider]  linaclotide (LINZESS) 290 MCG CAPS capsule TAKE 1 CAPSULE BY MOUTH EVERY OTHER DAY AS NEEDED 01/09/18   [provider]  Menthol-Methyl Salicylate ([place]) 0.5-15 % CREA APPLY A SMALL AMOUNT TO AFFECTED AREA FOUR TIMES A DAY NECK, BACK OR JOINT PAIN 07/19/21   [provider]  Methoxy PEG-Epoetin Beta (MIRCERA IJ) Mircera 04/06/21 04/05/22  [provider]  multivitamin (RENA-VIT) TABS tablet Take by mouth. 03/22/20   [provider]  omega-3 acid ethyl esters (LOVAZA) 1 g capsule Take by mouth.    [provider]  temazepam (RESTORIL) 30 MG capsule Take 30 mg by mouth at bedtime as needed. 08/05/21   [provider]  VITAMIN D PO Take by mouth. 03/02/21 03/01/22  [provider]   Current Facility-Administered Medications  Medication Dose Route Frequency Provider Last Rate Last Admin    stroke: mapping our early stages of recovery book   Does not apply Once Orma Flaming, MD       acetaminophen (TYLENOL) tablet 650 mg  650 mg Oral Q4H PRN Orma Flaming, MD       Or   acetaminophen (TYLENOL) 160 MG/5ML solution 650 mg  650 mg Per Tube Q4H PRN Orma Flaming, MD       Or   acetaminophen (TYLENOL) suppository 650 mg  650 mg Rectal Q4H PRN Orma Flaming, MD       aspirin EC tablet 81 mg  81 mg Oral Daily Orma Flaming, MD       [START ON 02/21/2022] calcium acetate (PHOSLO) capsule 1,334 mg  1,334 mg Oral BID WC Orma Flaming, MD       heparin injection 5,000 Units  5,000 Units Subcutaneous Q8H Orma Flaming, MD       [START ON 02/21/2022] levothyroxine (SYNTHROID) tablet 88 mcg  88 mcg Oral QAC breakfast Orma Flaming, MD       rosuvastatin (CRESTOR) tablet 20 mg  20 mg Oral Daily Orma Flaming, MD       senna-docusate (Senokot-S) tablet 1 tablet  1 tablet Oral QHS PRN Orma Flaming, MD       Current Outpatient Medications  Medication Sig Dispense Refill   aspirin 81 MG EC tablet Take 81 mg by mouth daily.     b complex-C-folic acid 1 MG capsule Take 1 capsule by mouth at bedtime.     calcium acetate (PHOSLO) 667 MG capsule Take 1,334 mg by mouth 2 (two) times daily with a meal.     famotidine (PEPCID) 40 MG tablet Take 1 tablet by mouth at bedtime.     levothyroxine (SYNTHROID) 88 MCG tablet Take 88 mcg by mouth daily before breakfast.     melatonin 3 MG TABS tablet TAKE ONE CAP/TAB BY MOUTH AT BEDTIME AS NEEDED FOR SLEEP     rosuvastatin (CRESTOR) 20 MG tablet Take 20 mg by mouth daily.     senna-docusate (SENOKOT-S) 8.6-50 MG tablet TAKE 2 TABLETS BY MOUTH ONCE DAILY CONSTIPATION     Cinacalcet HCl (SENSIPAR PO) Take 1 capsule by mouth daily.     ethyl chloride spray  PLEASE SEE ATTACHED FOR DETAILED DIRECTIONS     gabapentin (NEURONTIN) 100 MG capsule Take by mouth.     heparin 1000 unit/mL SOLN injection Heparin Sodium (Porcine) 1,000 Units/mL Systemic     lidocaine (XYLOCAINE) 5 % ointment APPLY A SMALL AMOUNT TO AFFECTED AREA TWICE DAILY AS NEEDED NECK, BACK OR JOINT PAIN     linaclotide (LINZESS) 290 MCG CAPS capsule TAKE 1 CAPSULE BY MOUTH EVERY OTHER DAY AS NEEDED     Menthol-Methyl Salicylate (THERA-GESIC) 0.5-15 % CREA APPLY A SMALL AMOUNT TO AFFECTED AREA FOUR TIMES A DAY NECK, BACK OR JOINT PAIN     Methoxy PEG-Epoetin Beta (MIRCERA IJ) Mircera  multivitamin (RENA-VIT) TABS tablet Take by mouth.     omega-3 acid ethyl esters (LOVAZA) 1 g capsule Take by mouth.     temazepam (RESTORIL) 30 MG capsule Take 30 mg by mouth at bedtime as needed.     VITAMIN D PO Take by mouth.     Labs: Basic Metabolic Panel: Recent Labs  Lab 02/20/22 1048  NA 141  K 4.2  CL 102  CO2 26  GLUCOSE 131*  BUN 57*  CREATININE 9.67*  CALCIUM 8.9   Liver Function Tests: No results for input(s): AST, ALT, ALKPHOS, BILITOT, PROT, ALBUMIN in the last 168 hours. No results for input(s): LIPASE, AMYLASE in the last 168 hours. No results for input(s): AMMONIA in the last 168 hours. CBC: Recent Labs  Lab 02/20/22 1048  WBC 7.5  HGB 10.5*  HCT 33.0*  MCV 108.6*  PLT 257   Cardiac Enzymes: No results for input(s): CKTOTAL, CKMB, CKMBINDEX, TROPONINI in the last 168 hours. CBG: No results for input(s): GLUCAP in the last 168 hours. Iron Studies: No results for input(s): IRON, TIBC, TRANSFERRIN, FERRITIN in the last 72 hours. Studies/Results: CT ANGIO HEAD NECK W WO CM  Result Date: 02/20/2022 CLINICAL DATA:  Stroke/TIA, determine embolic source. Additional history provided: Dizziness. EXAM: CT ANGIOGRAPHY HEAD AND NECK TECHNIQUE: Multidetector CT imaging of the head and neck was performed using the standard protocol during bolus administration of intravenous  contrast. Multiplanar CT image reconstructions and MIPs were obtained to evaluate the vascular anatomy. Carotid stenosis measurements (when applicable) are obtained utilizing NASCET criteria, using the distal internal carotid diameter as the denominator. RADIATION DOSE REDUCTION: This exam was performed according to the departmental dose-optimization program which includes automated exposure control, adjustment of the mA and/or kV according to patient size and/or use of iterative reconstruction technique. CONTRAST:  61m OMNIPAQUE IOHEXOL 350 MG/ML SOLN COMPARISON:  Brain MRI 02/20/2022.  Head CT 02/20/2022. FINDINGS: CTA NECK FINDINGS Aortic arch: Standard aortic branching. Atherosclerotic plaque within the visualized aortic arch and proximal major branch vessels of the neck. No hemodynamically significant innominate or proximal subclavian artery stenosis. Right carotid system: CCA and ICA patent within the neck. Prominent soft and calcified plaque about the carotid bifurcation and within the proximal ICA as well as proximal ECA. Resultant stenosis at the origin of the right ICA of greater than 70%. Severe, near occlusive stenosis at the origin of the ECA. Left carotid system: CCA and ICA patent within the neck. Advanced soft and calcified plaque about the carotid bifurcation and within the proximal ICA. Resultant severe, near occlusive stenosis at the origin of the left ICA. Mild calcified plaque is also present at the CCA origin. Partially retropharyngeal course of the ICA. Vertebral arteries: Vertebral arteries codominant and patent within the neck. Nonstenotic calcified plaque at the origin of both vessels. Skeleton: Cervicothoracic levocurvature. Partially imaged thoracic dextrocurvature more inferiorly. Advanced cervical spondylosis. C7-T1 grade 1 anterolisthesis. No acute bony abnormality or aggressive osseous lesion. Other neck: No neck mass or cervical lymphadenopathy. Upper chest: No consolidation within  the imaged lung apices. Review of the MIP images confirms the above findings CTA HEAD FINDINGS Anterior circulation: The intracranial internal carotid arteries are patent. Calcified plaque within both vessels. No significant stenosis of the intracranial right ICA. Mild-to-moderate narrowing of the paraclinoid left ICA. The M1 middle cerebral arteries are patent. No M2 proximal branch occlusion or high-grade proximal stenosis is identified. The anterior cerebral arteries are patent. Posterior circulation: The intracranial vertebral arteries are patent. Atherosclerotic  plaque within both vessels with no more than mild stenosis. The basilar artery is patent. The posterior cerebral arteries are patent. Posterior communicating arteries are diminutive or absent bilaterally. Venous sinuses: Within the limitations of contrast timing, no convincing thrombus. Anatomic variants: As described. Review of the MIP images confirms the above findings IMPRESSION: CTA neck: 1. The common carotid and internal carotid arteries are patent within the neck. However, there is prominent soft and calcified plaque about the carotid bifurcations and within the proximal ICAs, bilaterally. Resultant severe, near-occlusive stenosis at the origin of the left ICA (greater than 90%). There is also severe stenosis at the origin of the right ICA (greater than 70%). Additionally, there is severe, near-occlusive stenosis at the origin of the right external carotid artery. 2. Vertebral arteries codominant and patent within the neck without significant stenosis. 3.  Aortic Atherosclerosis (ICD10-I70.0). 4. Advanced cervical spondylosis. 5. Cervicothoracic spinal curvature as described. 6. C7-T1 grade 1 anterolisthesis. CTA head: 1. No intracranial large vessel occlusion or proximal high-grade arterial stenosis. 2. Atherosclerotic plaque within the intracranial internal carotid arteries and vertebral arteries, with no more than mild stenosis at these sites.  Electronically Signed   By: Kellie Simmering D.O.   On: 02/20/2022 16:01   CT HEAD WO CONTRAST  Result Date: 02/20/2022 CLINICAL DATA:  Dizziness EXAM: CT HEAD WITHOUT CONTRAST TECHNIQUE: Contiguous axial images were obtained from the base of the skull through the vertex without intravenous contrast. RADIATION DOSE REDUCTION: This exam was performed according to the departmental dose-optimization program which includes automated exposure control, adjustment of the mA and/or kV according to patient size and/or use of iterative reconstruction technique. COMPARISON:  None. FINDINGS: Brain: No acute intracranial findings are seen. There are no signs of bleeding. Cortical sulci are prominent. There is decreased density in periventricular white matter. Vascular: There are scattered arterial calcifications. Skull: No fracture is seen. Sinuses/Orbits: There is mild mucosal thickening in the ethmoid sinus. Postsurgical changes are noted in both optic globes, possibly previous cataract surgery. Other: None IMPRESSION: No acute intracranial findings are seen in noncontrast CT brain. Atrophy. Small-vessel disease. Mild chronic sinusitis. Electronically Signed   By: Elmer Picker M.D.   On: 02/20/2022 11:04   MR BRAIN WO CONTRAST  Result Date: 02/20/2022 EXAM: MRI HEAD WITHOUT CONTRAST TECHNIQUE: Multiplanar, multiecho pulse sequences of the brain and surrounding structures were obtained without intravenous contrast. COMPARISON:  Same day CT head. FINDINGS: Brain: Punctate focus of apparent DWI hyperintensity in the high left parietal white matter (series 3, image 32) without definite ADC correlate. Mild-to-moderate scattered T2/FLAIR hyperintensities within the white matter, nonspecific but compatible with chronic microvascular ischemic disease. Cerebral atrophy with ex vacuo ventricular dilation. No evidence of acute hemorrhage, mass lesion, midline shift, or extra-axial fluid collection. Vascular: Abnormal flow void  within the proximal left intradural vertebral artery. Otherwise, major arterial flow voids are maintained. Skull and upper cervical spine: Normal marrow signal. Sinuses/Orbits: Clear visualized sinuses.  Unremarkable orbits. Other: No mastoid effusions IMPRESSION: 1. Punctate focus of apparent DWI hyperintensity in the high left parietal white matter may represent a tiny acute/subacute infarct or artifact. 2. Abnormal flow void within the proximal left intradural vertebral artery, potentially representing significant stenosis or artifact. A CTA could further evaluate if clinically indicated. 3. Chronic microvascular disease and cerebral atrophy (ICD10-G31.9). Electronically Signed   By: Margaretha Sheffield M.D.   On: 02/20/2022 13:53    Physical Exam: Vitals:   02/20/22 1230 02/20/22 1403 02/20/22 1430 02/20/22 1500  BP: Marland Kitchen)  144/64 138/76 (!) 155/80 (!) 151/71  Pulse: 66 65 70 63  Resp: '20 15 14 12  '$ Temp:      SpO2: 95% 94% 98% 100%  Weight:      Height:         General: WDWN NAD Head: NCAT sclera not icteric  Lungs: CTA bilaterally. No wheeze, rales or rhonchi. Breathing is unlabored. Heart: RRR. No murmur, rubs or gallops.  Abdomen: soft, nontender, +BS Lower extremities: no edema BLLE Neuro: AAOx3. Moves all extremities spontaneously. Dialysis Access: L AVF (+) B/T  Dialysis Orders:  MWF - [place] 3hrs, BFR 350, DFR 600,  EDW 87.5kg, 2K/ 2Ca Heparin 3000 unit bolus Mircera 50 mcg q4wks - last 02/15/22 Calcitriol 1.60mg PO qHD-last 02/17/22  Assessment/Plan: CVA-Possible embolic etiology. Neuro following. CVA work-up in progress: CT Head, MR Brain, and CT Angio Head.  ESRD - on HD MWF. Plan for HD tomorrow 1st shift d/t current high patient census on HD unit Hypertension/volume  - Euvolemic on exam; Bps stable Anemia of CKD - Hgb 10.5. ESA not due yet. Secondary Hyperparathyroidism - Ca okay for now. Will check PO4 in AM. Continue binders. Nutrition - Renal diet with  fluid restriction  CTobie Poet NP [facility] 02/20/2022, 6:51 PM

## 2022-02-21 ENCOUNTER — Observation Stay (HOSPITAL_COMMUNITY): Payer: No Typology Code available for payment source

## 2022-02-21 ENCOUNTER — Other Ambulatory Visit (HOSPITAL_COMMUNITY): Payer: No Typology Code available for payment source

## 2022-02-21 DIAGNOSIS — N186 End stage renal disease: Secondary | ICD-10-CM | POA: Diagnosis present

## 2022-02-21 DIAGNOSIS — Z8249 Family history of ischemic heart disease and other diseases of the circulatory system: Secondary | ICD-10-CM | POA: Diagnosis not present

## 2022-02-21 DIAGNOSIS — M545 Low back pain, unspecified: Secondary | ICD-10-CM | POA: Diagnosis present

## 2022-02-21 DIAGNOSIS — Z888 Allergy status to other drugs, medicaments and biological substances status: Secondary | ICD-10-CM | POA: Diagnosis not present

## 2022-02-21 DIAGNOSIS — Z880 Allergy status to penicillin: Secondary | ICD-10-CM | POA: Diagnosis not present

## 2022-02-21 DIAGNOSIS — E7800 Pure hypercholesterolemia, unspecified: Secondary | ICD-10-CM | POA: Diagnosis not present

## 2022-02-21 DIAGNOSIS — Z992 Dependence on renal dialysis: Secondary | ICD-10-CM | POA: Diagnosis not present

## 2022-02-21 DIAGNOSIS — G8929 Other chronic pain: Secondary | ICD-10-CM | POA: Diagnosis present

## 2022-02-21 DIAGNOSIS — Z87891 Personal history of nicotine dependence: Secondary | ICD-10-CM | POA: Diagnosis not present

## 2022-02-21 DIAGNOSIS — Z7982 Long term (current) use of aspirin: Secondary | ICD-10-CM | POA: Diagnosis not present

## 2022-02-21 DIAGNOSIS — E039 Hypothyroidism, unspecified: Secondary | ICD-10-CM | POA: Diagnosis present

## 2022-02-21 DIAGNOSIS — R42 Dizziness and giddiness: Secondary | ICD-10-CM | POA: Diagnosis not present

## 2022-02-21 DIAGNOSIS — H8110 Benign paroxysmal vertigo, unspecified ear: Secondary | ICD-10-CM

## 2022-02-21 DIAGNOSIS — I6523 Occlusion and stenosis of bilateral carotid arteries: Principal | ICD-10-CM

## 2022-02-21 DIAGNOSIS — D631 Anemia in chronic kidney disease: Secondary | ICD-10-CM | POA: Diagnosis present

## 2022-02-21 DIAGNOSIS — I120 Hypertensive chronic kidney disease with stage 5 chronic kidney disease or end stage renal disease: Secondary | ICD-10-CM | POA: Diagnosis present

## 2022-02-21 DIAGNOSIS — Z8546 Personal history of malignant neoplasm of prostate: Secondary | ICD-10-CM | POA: Diagnosis not present

## 2022-02-21 DIAGNOSIS — I639 Cerebral infarction, unspecified: Secondary | ICD-10-CM | POA: Diagnosis present

## 2022-02-21 DIAGNOSIS — I6389 Other cerebral infarction: Secondary | ICD-10-CM

## 2022-02-21 DIAGNOSIS — Z20822 Contact with and (suspected) exposure to covid-19: Secondary | ICD-10-CM | POA: Diagnosis present

## 2022-02-21 DIAGNOSIS — H5509 Other forms of nystagmus: Secondary | ICD-10-CM | POA: Diagnosis present

## 2022-02-21 DIAGNOSIS — E785 Hyperlipidemia, unspecified: Secondary | ICD-10-CM | POA: Diagnosis present

## 2022-02-21 DIAGNOSIS — E1122 Type 2 diabetes mellitus with diabetic chronic kidney disease: Secondary | ICD-10-CM | POA: Diagnosis present

## 2022-02-21 DIAGNOSIS — N2581 Secondary hyperparathyroidism of renal origin: Secondary | ICD-10-CM | POA: Diagnosis present

## 2022-02-21 DIAGNOSIS — H8113 Benign paroxysmal vertigo, bilateral: Secondary | ICD-10-CM | POA: Diagnosis present

## 2022-02-21 LAB — CBC
HCT: 31.5 % — ABNORMAL LOW (ref 39.0–52.0)
Hemoglobin: 9.8 g/dL — ABNORMAL LOW (ref 13.0–17.0)
MCH: 33.4 pg (ref 26.0–34.0)
MCHC: 31.1 g/dL (ref 30.0–36.0)
MCV: 107.5 fL — ABNORMAL HIGH (ref 80.0–100.0)
Platelets: 250 10*3/uL (ref 150–400)
RBC: 2.93 MIL/uL — ABNORMAL LOW (ref 4.22–5.81)
RDW: 14.6 % (ref 11.5–15.5)
WBC: 6.3 10*3/uL (ref 4.0–10.5)
nRBC: 0.3 % — ABNORMAL HIGH (ref 0.0–0.2)

## 2022-02-21 LAB — RENAL FUNCTION PANEL
Albumin: 3.1 g/dL — ABNORMAL LOW (ref 3.5–5.0)
Albumin: 2.7 g/dL — ABNORMAL LOW (ref 3.5–5.0)
Anion gap: 14 (ref 5–15)
Anion gap: 13 (ref 5–15)
BUN: 58 mg/dL — ABNORMAL HIGH (ref 8–23)
BUN: 72 mg/dL — ABNORMAL HIGH (ref 8–23)
CO2: 26 mmol/L (ref 22–32)
CO2: 23 mmol/L (ref 22–32)
Calcium: 8.9 mg/dL (ref 8.9–10.3)
Calcium: 8.7 mg/dL — ABNORMAL LOW (ref 8.9–10.3)
Chloride: 101 mmol/L (ref 98–111)
Chloride: 101 mmol/L (ref 98–111)
Creatinine, Ser: 9.65 mg/dL — ABNORMAL HIGH (ref 0.61–1.24)
Creatinine, Ser: 10.73 mg/dL — ABNORMAL HIGH (ref 0.61–1.24)
GFR, Estimated: 5 mL/min — ABNORMAL LOW (ref >60)
GFR, Estimated: 4 mL/min — ABNORMAL LOW (ref >60)
Glucose, Bld: 120 mg/dL — ABNORMAL HIGH (ref 70–99)
Glucose, Bld: 100 mg/dL — ABNORMAL HIGH (ref 70–99)
Phosphorus: 6.1 mg/dL — ABNORMAL HIGH (ref 2.5–4.6)
Phosphorus: 8.1 mg/dL — ABNORMAL HIGH (ref 2.5–4.6)
Potassium: 4.3 mmol/L (ref 3.5–5.1)
Potassium: 4.5 mmol/L (ref 3.5–5.1)
Sodium: 141 mmol/L (ref 135–145)
Sodium: 137 mmol/L (ref 135–145)

## 2022-02-21 LAB — ECHOCARDIOGRAM COMPLETE
AR max vel: 1.21 cm2
AV Area VTI: 1.04 cm2
AV Area mean vel: 1.08 cm2
AV Mean grad: 26.3 mmHg
AV Peak grad: 42.4 mmHg
Ao pk vel: 3.26 m/s
Area-P 1/2: 3.42 cm2
Calc EF: 40.8 %
Height: 69 in
Radius: 0.3 cm
S' Lateral: 3.2 cm
Single Plane A2C EF: 42.3 %
Single Plane A4C EF: 42.3 %
Weight: 3068.8 oz

## 2022-02-21 LAB — LIPID PANEL
Cholesterol: 111 mg/dL (ref 0–200)
HDL: 56 mg/dL (ref >40)
LDL Cholesterol: 44 mg/dL (ref 0–99)
Total CHOL/HDL Ratio: 2 RATIO
Triglycerides: 53 mg/dL (ref <150)
VLDL: 11 mg/dL (ref 0–40)

## 2022-02-21 LAB — FOLATE: Folate: 30.2 ng/mL (ref >5.9)

## 2022-02-21 LAB — HEPATITIS B SURFACE ANTIGEN: Hepatitis B Surface Ag: NONREACTIVE

## 2022-02-21 LAB — GLUCOSE, CAPILLARY: Glucose-Capillary: 108 mg/dL — ABNORMAL HIGH (ref 70–99)

## 2022-02-21 LAB — HEMOGLOBIN A1C
Hgb A1c MFr Bld: 4.9 % (ref 4.8–5.6)
Mean Plasma Glucose: 93.93 mg/dL

## 2022-02-21 LAB — VITAMIN B12: Vitamin B-12: 207 pg/mL (ref 180–914)

## 2022-02-21 LAB — HEPATITIS B SURFACE ANTIBODY,QUALITATIVE: Hep B S Ab: REACTIVE — AB

## 2022-02-21 MED ORDER — SODIUM CHLORIDE 0.9 % IV SOLN: 100.0000 mL | INTRAVENOUS | Status: DC | PRN

## 2022-02-21 MED ORDER — CHLORHEXIDINE GLUCONATE CLOTH 2 % EX PADS
6.0000 | MEDICATED_PAD | Freq: Every day | CUTANEOUS | Status: DC
  Administered 2022-02-22: 6 via TOPICAL

## 2022-02-21 MED ORDER — LIDOCAINE HCL (PF) 1 % IJ SOLN: 5.0000 mL | INTRAMUSCULAR | Status: DC | PRN

## 2022-02-21 MED ORDER — PENTAFLUOROPROP-TETRAFLUOROETH EX AERO: 1.0000 "application " | INHALATION_SPRAY | CUTANEOUS | Status: DC | PRN

## 2022-02-21 MED ORDER — ALTEPLASE 2 MG IJ SOLR: 2.0000 mg | Freq: Once | INTRAMUSCULAR | Status: DC | PRN

## 2022-02-21 MED ORDER — LIDOCAINE-PRILOCAINE 2.5-2.5 % EX CREA: 1.0000 "application " | TOPICAL_CREAM | CUTANEOUS | Status: DC | PRN

## 2022-02-21 MED ORDER — HEPARIN SODIUM (PORCINE) 1000 UNIT/ML DIALYSIS
1000.0000 [IU] | INTRAMUSCULAR | Status: DC | PRN
  Filled 2022-02-21: qty 1

## 2022-02-21 NOTE — Progress Notes (Signed)
[facility] Progress Note   Subjective:    Patient seen and examined at bedside. No complaints. Plan for HD later this afternoon/evening.  Objective Vitals:   02/20/22 2035 02/20/22 2230 02/21/22 0341 02/21/22 0733  BP: 135/62 (!) 119/59 133/60 (!) 148/62  Pulse: 67 63 67 64  Resp: '18 17 18 18  '$ Temp:      SpO2: 97% 98% 95% 96%  Weight:      Height:       Physical Exam General: Elderly male; lying in bed; NAD  Lungs: Clear anteriorly and laterally. No wheeze, rales or rhonchi. Breathing is unlabored. Heart: RRR. No murmur, rubs or gallops.  Abdomen: soft, nontender, +BS Lower extremities: no edema BLLE Neuro: AAOx3. Moves all extremities spontaneously. Dialysis Access: L AVF (+) B/T  Filed Weights   02/20/22 1038  Weight: 87 kg   No intake or output data in the 24 hours ending 02/21/22 1500  Additional Objective Labs: Basic Metabolic Panel: Recent Labs  Lab 02/20/22 1048  NA 141   141  K 4.3   4.2  CL 101   102  CO2 26   26  GLUCOSE 120*   131*  BUN 58*   57*  CREATININE 9.65*   9.67*  CALCIUM 8.9   8.9  PHOS 6.1*   Liver Function Tests: Recent Labs  Lab 02/20/22 1048  ALBUMIN 3.1*   No results for input(s): LIPASE, AMYLASE in the last 168 hours. CBC: Recent Labs  Lab 02/20/22 1048  WBC 7.5  HGB 10.5*  HCT 33.0*  MCV 108.6*  PLT 257   Blood Culture No results found for: SDES, SPECREQUEST, CULT, REPTSTATUS  Cardiac Enzymes: No results for input(s): CKTOTAL, CKMB, CKMBINDEX, TROPONINI in the last 168 hours. CBG: No results for input(s): GLUCAP in the last 168 hours. Iron Studies: No results for input(s): IRON, TIBC, TRANSFERRIN, FERRITIN in the last 72 hours. No results found for: INR, PROTIME Studies/Results: CT ANGIO HEAD NECK W WO CM  Result Date: 02/20/2022 CLINICAL DATA:  Stroke/TIA, determine embolic source. Additional history provided: Dizziness. EXAM: CT ANGIOGRAPHY HEAD AND NECK TECHNIQUE: Multidetector CT imaging of  the head and neck was performed using the standard protocol during bolus administration of intravenous contrast. Multiplanar CT image reconstructions and MIPs were obtained to evaluate the vascular anatomy. Carotid stenosis measurements (when applicable) are obtained utilizing NASCET criteria, using the distal internal carotid diameter as the denominator. RADIATION DOSE REDUCTION: This exam was performed according to the departmental dose-optimization program which includes automated exposure control, adjustment of the mA and/or kV according to patient size and/or use of iterative reconstruction technique. CONTRAST:  87m OMNIPAQUE IOHEXOL 350 MG/ML SOLN COMPARISON:  Brain MRI 02/20/2022.  Head CT 02/20/2022. FINDINGS: CTA NECK FINDINGS Aortic arch: Standard aortic branching. Atherosclerotic plaque within the visualized aortic arch and proximal major branch vessels of the neck. No hemodynamically significant innominate or proximal subclavian artery stenosis. Right carotid system: CCA and ICA patent within the neck. Prominent soft and calcified plaque about the carotid bifurcation and within the proximal ICA as well as proximal ECA. Resultant stenosis at the origin of the right ICA of greater than 70%. Severe, near occlusive stenosis at the origin of the ECA. Left carotid system: CCA and ICA patent within the neck. Advanced soft and calcified plaque about the carotid bifurcation and within the proximal ICA. Resultant severe, near occlusive stenosis at the origin of the left ICA. Mild calcified plaque is also present at the CCA origin. Partially  retropharyngeal course of the ICA. Vertebral arteries: Vertebral arteries codominant and patent within the neck. Nonstenotic calcified plaque at the origin of both vessels. Skeleton: Cervicothoracic levocurvature. Partially imaged thoracic dextrocurvature more inferiorly. Advanced cervical spondylosis. C7-T1 grade 1 anterolisthesis. No acute bony abnormality or aggressive  osseous lesion. Other neck: No neck mass or cervical lymphadenopathy. Upper chest: No consolidation within the imaged lung apices. Review of the MIP images confirms the above findings CTA HEAD FINDINGS Anterior circulation: The intracranial internal carotid arteries are patent. Calcified plaque within both vessels. No significant stenosis of the intracranial right ICA. Mild-to-moderate narrowing of the paraclinoid left ICA. The M1 middle cerebral arteries are patent. No M2 proximal branch occlusion or high-grade proximal stenosis is identified. The anterior cerebral arteries are patent. Posterior circulation: The intracranial vertebral arteries are patent. Atherosclerotic plaque within both vessels with no more than mild stenosis. The basilar artery is patent. The posterior cerebral arteries are patent. Posterior communicating arteries are diminutive or absent bilaterally. Venous sinuses: Within the limitations of contrast timing, no convincing thrombus. Anatomic variants: As described. Review of the MIP images confirms the above findings IMPRESSION: CTA neck: 1. The common carotid and internal carotid arteries are patent within the neck. However, there is prominent soft and calcified plaque about the carotid bifurcations and within the proximal ICAs, bilaterally. Resultant severe, near-occlusive stenosis at the origin of the left ICA (greater than 90%). There is also severe stenosis at the origin of the right ICA (greater than 70%). Additionally, there is severe, near-occlusive stenosis at the origin of the right external carotid artery. 2. Vertebral arteries codominant and patent within the neck without significant stenosis. 3.  Aortic Atherosclerosis (ICD10-I70.0). 4. Advanced cervical spondylosis. 5. Cervicothoracic spinal curvature as described. 6. C7-T1 grade 1 anterolisthesis. CTA head: 1. No intracranial large vessel occlusion or proximal high-grade arterial stenosis. 2. Atherosclerotic plaque within the  intracranial internal carotid arteries and vertebral arteries, with no more than mild stenosis at these sites. Electronically Signed   By: Kellie Simmering D.O.   On: 02/20/2022 16:01   CT HEAD WO CONTRAST  Result Date: 02/20/2022 CLINICAL DATA:  Dizziness EXAM: CT HEAD WITHOUT CONTRAST TECHNIQUE: Contiguous axial images were obtained from the base of the skull through the vertex without intravenous contrast. RADIATION DOSE REDUCTION: This exam was performed according to the departmental dose-optimization program which includes automated exposure control, adjustment of the mA and/or kV according to patient size and/or use of iterative reconstruction technique. COMPARISON:  None. FINDINGS: Brain: No acute intracranial findings are seen. There are no signs of bleeding. Cortical sulci are prominent. There is decreased density in periventricular white matter. Vascular: There are scattered arterial calcifications. Skull: No fracture is seen. Sinuses/Orbits: There is mild mucosal thickening in the ethmoid sinus. Postsurgical changes are noted in both optic globes, possibly previous cataract surgery. Other: None IMPRESSION: No acute intracranial findings are seen in noncontrast CT brain. Atrophy. Small-vessel disease. Mild chronic sinusitis. Electronically Signed   By: Elmer Picker M.D.   On: 02/20/2022 11:04   MR BRAIN WO CONTRAST  Result Date: 02/20/2022 EXAM: MRI HEAD WITHOUT CONTRAST TECHNIQUE: Multiplanar, multiecho pulse sequences of the brain and surrounding structures were obtained without intravenous contrast. COMPARISON:  Same day CT head. FINDINGS: Brain: Punctate focus of apparent DWI hyperintensity in the high left parietal white matter (series 3, image 32) without definite ADC correlate. Mild-to-moderate scattered T2/FLAIR hyperintensities within the white matter, nonspecific but compatible with chronic microvascular ischemic disease. Cerebral atrophy with ex vacuo ventricular  dilation. No evidence  of acute hemorrhage, mass lesion, midline shift, or extra-axial fluid collection. Vascular: Abnormal flow void within the proximal left intradural vertebral artery. Otherwise, major arterial flow voids are maintained. Skull and upper cervical spine: Normal marrow signal. Sinuses/Orbits: Clear visualized sinuses.  Unremarkable orbits. Other: No mastoid effusions IMPRESSION: 1. Punctate focus of apparent DWI hyperintensity in the high left parietal white matter may represent a tiny acute/subacute infarct or artifact. 2. Abnormal flow void within the proximal left intradural vertebral artery, potentially representing significant stenosis or artifact. A CTA could further evaluate if clinically indicated. 3. Chronic microvascular disease and cerebral atrophy (ICD10-G31.9). Electronically Signed   By: Margaretha Sheffield M.D.   On: 02/20/2022 13:53   VAS US CAROTID  Result Date: 02/20/2022 Carotid Arterial Duplex Study Patient Name:  ARRINGTON YOHE  Date of Exam:   02/20/2022 Medical Rec #: [id number]        Accession #:    [id number] Date of Birth: [birth year]       Patient Gender: M Patient Age:   72 years Exam Location:  [facility] Procedure:      VAS US CAROTID Referring Phys: Orma Flaming --------------------------------------------------------------------------------  Indications:       Carotid artery disease and Dizziness. Limitations        Today's exam was limited due to the patient's respiratory                    variation. Comparison Study:  No prior study Performing Technologist: Maudry Mayhew MHA, RDMS, RVT, RDCS  Examination Guidelines: A complete evaluation includes B-mode imaging, spectral Doppler, color Doppler, and power Doppler as needed of all accessible portions of each vessel. Bilateral testing is considered an integral part of a complete examination. Limited examinations for reoccurring indications may be performed as noted.  Right Carotid Findings:  +----------+--------+--------+--------+------------------+---------+             PSV cm/s EDV cm/s Stenosis Plaque Description Comments   +----------+--------+--------+--------+------------------+---------+  CCA Prox   76       11                                              +----------+--------+--------+--------+------------------+---------+  CCA Distal 71       12                calcific           Shadowing  +----------+--------+--------+--------+------------------+---------+  ICA Prox   257      60       40-59%   calcific           Shadowing  +----------+--------+--------+--------+------------------+---------+  ICA Distal 97       13                                              +----------+--------+--------+--------+------------------+---------+  ECA        190                        calcific           shadowing  +----------+--------+--------+--------+------------------+---------+ +----------+--------+-------+----------------+-------------------+             PSV cm/s EDV cms Describe  Arm Pressure (mmHG)  +----------+--------+-------+----------------+-------------------+  Subclavian 210              Multiphasic, WNL                      +----------+--------+-------+----------------+-------------------+ +---------+--------+--+--------+--+---------+  Vertebral PSV cm/s 70 EDV cm/s 15 Antegrade  +---------+--------+--+--------+--+---------+  Left Carotid Findings: +----------+--------+--------+--------+-------------------------+---------+             PSV cm/s EDV cm/s Stenosis Plaque Description        Comments   +----------+--------+--------+--------+-------------------------+---------+  CCA Prox   78       7                                                      +----------+--------+--------+--------+-------------------------+---------+  CCA Distal 125      13                                                     +----------+--------+--------+--------+-------------------------+---------+  ICA Prox   176      31        40-59%   calcific and heterogenous Shadowing  +----------+--------+--------+--------+-------------------------+---------+  ICA Distal 109      22                                                     +----------+--------+--------+--------+-------------------------+---------+  ECA        96                         calcific                  shadowing  +----------+--------+--------+--------+-------------------------+---------+ +----------+--------+--------+---------------------+-------------------+             PSV cm/s EDV cm/s Describe              Arm Pressure (mmHG)  +----------+--------+--------+---------------------+-------------------+  Subclavian 281               Monophasic, turbulent                      +----------+--------+--------+---------------------+-------------------+ +---------+--------+--+--------+--+---------+  Vertebral PSV cm/s 33 EDV cm/s 10 Antegrade  +---------+--------+--+--------+--+---------+   Summary: Right Carotid: Velocities in the right ICA are consistent with an upper range                40-59% stenosis. Left Carotid: Velocities in the left ICA are consistent with a 40-59% stenosis               by peak systolic velocity, upper range 1-39% stenosis by end               diastolic velocity. Vertebrals:  Bilateral vertebral arteries demonstrate antegrade flow. Subclavians: Left subclavian artery flow was disturbed. Normal flow hemodynamics              were seen in the right subclavian artery. *See table(s) above for measurements and observations.  Electronically signed by Servando Snare MD on 02/20/2022 at 7:06:59 PM.  Final     Medications:  sodium chloride     sodium chloride      aspirin EC  81 mg Oral Daily   calcium acetate  1,334 mg Oral BID WC   Chlorhexidine Gluconate Cloth  6 each Topical Q0600   clopidogrel  75 mg Oral Daily   heparin  5,000 Units Subcutaneous Q8H   levothyroxine  88 mcg Oral QAC breakfast   lidocaine  1 patch Transdermal Q24H   rosuvastatin  20  mg Oral Daily    Dialysis Orders: MWF - [facility] 3hrs, BFR 350, DFR 600,  EDW 87.5kg, 2K/ 2Ca Heparin 3000 unit bolus Mircera 50 mcg q4wks - last 02/15/22 Calcitriol 1.84mg PO qHD-last 02/17/22  Assessment/Plan: CVA-Possible embolic etiology. Neuro following. CVA work-up in progress: CT Head, MR Brain, and CT Angio Head. Reviewed recent notes: CTA shows no large vessel occlusion or proximal arterial stenosis but showed ICA stenosis-appears traeting with medical therapy vs need for surgical intervention. ESRD - on HD MWF. Plan for HD today later this afternoon/evening Hypertension/volume  - Euvolemic on exam; Bps stable Anemia of CKD - Hgb 10.5. ESA not due yet. Secondary Hyperparathyroidism - Ca okay for now. PO4 elevated. Continue binders. Nutrition - Renal diet with fluid restriction  CTobie Poet NP [facility] 02/21/2022,3:00 PM  LOS: 0 days

## 2022-02-21 NOTE — Progress Notes (Addendum)
STROKE TEAM PROGRESS NOTE   ATTENDING NOTE: I reviewed above note and agree with the assessment and plan. Pt was seen and examined.   88 year old male with history of ESRD on hemodialysis, anemia of chronic disease, hypertension, hyperlipidemia, diabetes, prostate cancer admitted for dizziness for 2 days with imbalance gait.  CT no acute abnormality.  MRI showed left parietal white matter punctate infarct.  CTA head and neck left ICA 90% and right ICA 70% stenosis.  Carotid Doppler bilateral 40 to 59% stenosis.  LDL 44 and A1c 4.9.  On exam, granddaughter at bedside, patient has hard of hearing but otherwise neuro intact without focal deficit. Per PT report, patient Dix-Hallpike test positive for BPPV.  Etiology for patient dizziness in the morning getting up from bed likely due to BPPV.  PT/OT recommend home health with maneuver training.  Patient stroke on MRI likely incidental finding, secondary to left ICA symptomatic stenosis.  Vascular surgery Dr. Donzetta Matters on board, will consider outpatient left CEA in 1 to 2 weeks.  Continue DAPT and statin, further antiplatelet regimen per VVS.  Patient also has right ICA stenosis, which asymptomatic at this time, will continue follow-up with vascular surgery as outpatient.  BP goal 130-150 before carotid revascularization.  For detailed assessment and plan, please refer to above as I have made changes wherever appropriate.   Neurology will sign off. Please call with questions. Pt will follow up with stroke clinic NP at [facility] in about 4 weeks. Thanks for the consult.   Rosalin Hawking, MD PhD Stroke Neurology 02/21/2022 6:03 PM    INTERVAL HISTORY His niece is at the bedside.  Patient is awake, A&Ox4, resting comfortably in bed. Stated that he had worsening dizziness with positional movement for the past few days. Noted that when he looks at the clock, that it moves up and down. Denied falls or feeling dizzy when he lays down at night. Patient is independent in  his ADLs/IADLs and drives himself to HD, confirmed by niece at bedside. Patient will be going to HD today. Slow b/l horizontal nystagmus noted when gazing to the left. Also noted dissociated p waves when in the room, concerning for arrhythmia. Patient denied heart palpitations.   Of note, PT noted nystagmus during evaluation and concerned for BPPV. Performed dix hallpike maneuver on him.   Vitals:   02/20/22 2035 02/20/22 2230 02/21/22 0341 02/21/22 0733  BP: 135/62 (!) 119/59 133/60 (!) 148/62  Pulse: 67 63 67 64  Resp: '18 17 18 18  '$ Temp:      SpO2: 97% 98% 95% 96%  Weight:      Height:       CBC:  Recent Labs  Lab 02/20/22 1048  WBC 7.5  HGB 10.5*  HCT 33.0*  MCV 108.6*  PLT 749   Basic Metabolic Panel:  Recent Labs  Lab 02/20/22 1048  NA 141   141  K 4.3   4.2  CL 101   102  CO2 26   26  GLUCOSE 120*   131*  BUN 58*   57*  CREATININE 9.65*   9.67*  CALCIUM 8.9   8.9  PHOS 6.1*   Lipid Panel:  Recent Labs  Lab 02/20/22 1048  CHOL 111  TRIG 53  HDL 56  CHOLHDL 2.0  VLDL 11  LDLCALC 44   HgbA1c:  Recent Labs  Lab 02/20/22 1048  HGBA1C 4.9   Urine Drug Screen: No results for input(s): LABOPIA, COCAINSCRNUR, LABBENZ, AMPHETMU, THCU, LABBARB in the  last 168 hours. Alcohol Level  Recent Labs  Lab 02/20/22 1630  ETH <10    IMAGING past 24 hours CT ANGIO HEAD NECK W WO CM  Result Date: 02/20/2022 CLINICAL DATA:  Stroke/TIA, determine embolic source. Additional history provided: Dizziness. EXAM: CT ANGIOGRAPHY HEAD AND NECK TECHNIQUE: Multidetector CT imaging of the head and neck was performed using the standard protocol during bolus administration of intravenous contrast. Multiplanar CT image reconstructions and MIPs were obtained to evaluate the vascular anatomy. Carotid stenosis measurements (when applicable) are obtained utilizing NASCET criteria, using the distal internal carotid diameter as the denominator. RADIATION DOSE REDUCTION: This exam was  performed according to the departmental dose-optimization program which includes automated exposure control, adjustment of the mA and/or kV according to patient size and/or use of iterative reconstruction technique. CONTRAST:  80m OMNIPAQUE IOHEXOL 350 MG/ML SOLN COMPARISON:  Brain MRI 02/20/2022.  Head CT 02/20/2022. FINDINGS: CTA NECK FINDINGS Aortic arch: Standard aortic branching. Atherosclerotic plaque within the visualized aortic arch and proximal major branch vessels of the neck. No hemodynamically significant innominate or proximal subclavian artery stenosis. Right carotid system: CCA and ICA patent within the neck. Prominent soft and calcified plaque about the carotid bifurcation and within the proximal ICA as well as proximal ECA. Resultant stenosis at the origin of the right ICA of greater than 70%. Severe, near occlusive stenosis at the origin of the ECA. Left carotid system: CCA and ICA patent within the neck. Advanced soft and calcified plaque about the carotid bifurcation and within the proximal ICA. Resultant severe, near occlusive stenosis at the origin of the left ICA. Mild calcified plaque is also present at the CCA origin. Partially retropharyngeal course of the ICA. Vertebral arteries: Vertebral arteries codominant and patent within the neck. Nonstenotic calcified plaque at the origin of both vessels. Skeleton: Cervicothoracic levocurvature. Partially imaged thoracic dextrocurvature more inferiorly. Advanced cervical spondylosis. C7-T1 grade 1 anterolisthesis. No acute bony abnormality or aggressive osseous lesion. Other neck: No neck mass or cervical lymphadenopathy. Upper chest: No consolidation within the imaged lung apices. Review of the MIP images confirms the above findings CTA HEAD FINDINGS Anterior circulation: The intracranial internal carotid arteries are patent. Calcified plaque within both vessels. No significant stenosis of the intracranial right ICA. Mild-to-moderate narrowing of  the paraclinoid left ICA. The M1 middle cerebral arteries are patent. No M2 proximal branch occlusion or high-grade proximal stenosis is identified. The anterior cerebral arteries are patent. Posterior circulation: The intracranial vertebral arteries are patent. Atherosclerotic plaque within both vessels with no more than mild stenosis. The basilar artery is patent. The posterior cerebral arteries are patent. Posterior communicating arteries are diminutive or absent bilaterally. Venous sinuses: Within the limitations of contrast timing, no convincing thrombus. Anatomic variants: As described. Review of the MIP images confirms the above findings IMPRESSION: CTA neck: 1. The common carotid and internal carotid arteries are patent within the neck. However, there is prominent soft and calcified plaque about the carotid bifurcations and within the proximal ICAs, bilaterally. Resultant severe, near-occlusive stenosis at the origin of the left ICA (greater than 90%). There is also severe stenosis at the origin of the right ICA (greater than 70%). Additionally, there is severe, near-occlusive stenosis at the origin of the right external carotid artery. 2. Vertebral arteries codominant and patent within the neck without significant stenosis. 3.  Aortic Atherosclerosis (ICD10-I70.0). 4. Advanced cervical spondylosis. 5. Cervicothoracic spinal curvature as described. 6. C7-T1 grade 1 anterolisthesis. CTA head: 1. No intracranial large vessel occlusion or proximal  high-grade arterial stenosis. 2. Atherosclerotic plaque within the intracranial internal carotid arteries and vertebral arteries, with no more than mild stenosis at these sites. Electronically Signed   By: Kellie Simmering D.O.   On: 02/20/2022 16:01   VAS US CAROTID  Result Date: 02/20/2022 Carotid Arterial Duplex Study Patient Name:  Noah Osborn  Date of Exam:   02/20/2022 Medical Rec #: [id number]        Accession #:    [id number] Date of Birth: [birth year]        Patient Gender: M Patient Age:   62 years Exam Location:  [facility] Procedure:      VAS US CAROTID Referring Phys: Orma Flaming --------------------------------------------------------------------------------  Indications:       Carotid artery disease and Dizziness. Limitations        Today's exam was limited due to the patient's respiratory                    variation. Comparison Study:  No prior study Performing Technologist: Maudry Mayhew MHA, RDMS, RVT, RDCS  Examination Guidelines: A complete evaluation includes B-mode imaging, spectral Doppler, color Doppler, and power Doppler as needed of all accessible portions of each vessel. Bilateral testing is considered an integral part of a complete examination. Limited examinations for reoccurring indications may be performed as noted.  Right Carotid Findings: +----------+--------+--------+--------+------------------+---------+             PSV cm/s EDV cm/s Stenosis Plaque Description Comments   +----------+--------+--------+--------+------------------+---------+  CCA Prox   76       11                                              +----------+--------+--------+--------+------------------+---------+  CCA Distal 71       12                calcific           Shadowing  +----------+--------+--------+--------+------------------+---------+  ICA Prox   257      60       40-59%   calcific           Shadowing  +----------+--------+--------+--------+------------------+---------+  ICA Distal 97       13                                              +----------+--------+--------+--------+------------------+---------+  ECA        190                        calcific           shadowing  +----------+--------+--------+--------+------------------+---------+ +----------+--------+-------+----------------+-------------------+             PSV cm/s EDV cms Describe         Arm Pressure (mmHG)  +----------+--------+-------+----------------+-------------------+   Subclavian 210              Multiphasic, WNL                      +----------+--------+-------+----------------+-------------------+ +---------+--------+--+--------+--+---------+  Vertebral PSV cm/s 70 EDV cm/s 15 Antegrade  +---------+--------+--+--------+--+---------+  Left Carotid Findings: +----------+--------+--------+--------+-------------------------+---------+             PSV cm/s  EDV cm/s Stenosis Plaque Description        Comments   +----------+--------+--------+--------+-------------------------+---------+  CCA Prox   78       7                                                      +----------+--------+--------+--------+-------------------------+---------+  CCA Distal 125      13                                                     +----------+--------+--------+--------+-------------------------+---------+  ICA Prox   176      31       40-59%   calcific and heterogenous Shadowing  +----------+--------+--------+--------+-------------------------+---------+  ICA Distal 109      22                                                     +----------+--------+--------+--------+-------------------------+---------+  ECA        96                         calcific                  shadowing  +----------+--------+--------+--------+-------------------------+---------+ +----------+--------+--------+---------------------+-------------------+             PSV cm/s EDV cm/s Describe              Arm Pressure (mmHG)  +----------+--------+--------+---------------------+-------------------+  Subclavian 281               Monophasic, turbulent                      +----------+--------+--------+---------------------+-------------------+ +---------+--------+--+--------+--+---------+  Vertebral PSV cm/s 33 EDV cm/s 10 Antegrade  +---------+--------+--+--------+--+---------+   Summary: Right Carotid: Velocities in the right ICA are consistent with an upper range                40-59% stenosis. Left Carotid: Velocities in the left ICA  are consistent with a 40-59% stenosis               by peak systolic velocity, upper range 1-39% stenosis by end               diastolic velocity. Vertebrals:  Bilateral vertebral arteries demonstrate antegrade flow. Subclavians: Left subclavian artery flow was disturbed. Normal flow hemodynamics              were seen in the right subclavian artery. *See table(s) above for measurements and observations.  Electronically signed by Servando Snare MD on 02/20/2022 at 7:06:59 PM.    Final     PHYSICAL EXAM Constitutional: Appears well-developed and well-nourished.  Psych: Affect appropriate to situation Eyes: No scleral injection Respiratory: Effort normal, non-labored breathing  Neuro: Mental Status: Patient is awake, alert, oriented to person, place, month, year, and situation. Patient is able to give a clear and coherent history. No signs of aphasia or neglect Cranial Nerves: II: Visual Fields are full. Pupils are equal, round, and reactive to  light.   III,IV, VI: EOMI without ptosis or diploplia.  V: Facial sensation is symmetric to temperature VII: Facial movement is symmetric resting and smiling VIII: Hearing is intact to voice, b/l horizontal nystagmus noted when gazing to the left XI: Shoulder shrug is symmetric XII: Tongue protrudes midline without atrophy or fasciculations Motor Tone is normal. Bulk is normal. 5/5 strength was present in all four extremities.  Sensory: Sensation is symmetric to light touch and temperature in the arms and legs. No extinction to DSS present.  Cerebellar: FNF and HKS are intact bilaterally  ASSESSMENT/PLAN Noah Osborn is a 88 y.o. male with history of ESRD (HD MWF), anemia or ESRD, HTN, HLD, T2DM, hypothyroidism, prostate cancer history presenting with progressive dizziness x4 days.  Patient has prominent ICA stenosis bilaterally, considering stenting after discussion with patient given patient's functional status despite his advanced age. MRI  showed punctate infarct in left parietal white matter that is acute vs subacute. Unclear if left parietal infarct on MRI are causing his sxs.  Stroke - likely incidental finding, punctate infarct at L parietal white matter due to severe left carotid stenosis Code Stroke CT head: No acute abnormality. Small vessel disease. Atrophy.  CTA head: No intracranial LVO or proximal high-grade arterial stenosis. Atherosclerotic plaque within the intracranial internal carotid arteries and vertebral arteries, with no more than mild stenosis at these sites.  CTA neck: Prominent soft and calcified plaque about the carotid bifurcations and within the proximal ICAs b/l. Left ICA >90% occlusion at the origin, right ICA >70% occlusion at the origin. Severe, near-occlusive stenosis at the origin of the R ECA. Aortic Atherosclerosis. Advanced cervical spondylosis.  MRI: Punctate focus of apparent DWI hyperintensity in the high left parietal white matter that may represent a tiny acute/subacute infarct or artifact.  Carotid Doppler: Right ICA 40-59% stenosis. Left ICA 16-10% by peak systolic velocity, upper range 1-39% stenosis by end diastolic velocity. Left subclavian artery flow was disturbed.  Echo: EF 60 to 65% LDL 44  HgbA1c 4.9   VTE prophylaxis - Heparin injection 5,000 units start: 02/20/22 1700       Diet   Diet renal with fluid restriction Fluid restriction: 1200 mL Fluid; Room service appropriate? Yes; Fluid consistency: Thin  aspirin 81 mg daily prior to admission, now on aspirin 81 mg daily and clopidogrel 75 mg daily DAPT, further antiplatelet regimen per VVS. Therapy recommendations:  Home health PT Home health OT   Rolling walker (2 wheels), Other (comment) (shower chair) (02/21/22)  Disposition:  Pending  Carotid stenosis CTA head and neck Left ICA >90% occlusion at the origin, right ICA >70% occlusion at the origin. Carotid Doppler: Right ICA 40-59% stenosis. Left ICA 96-04% by peak systolic  velocity, upper range 1-39% stenosis by end diastolic velocity.  VVS Dr. Donzetta Matters on board, will schedule left CEA as outpt  BPPV Positional dizziness x 4 days, more at getting up from bed in am [facility] tests positive per PT Recommend HH with maneuver training  Hypertension Home meds: N/A  Stable Long-term BP goal 120-150 given ICA stenosis.  Avoid low BP  Hyperlipidemia Home meds:  Rosuvastatin 20 mg, resumed in hospital LDL 44, goal < 70 Continue statin at discharge  Diabetes type II Controlled Home meds:  N/A HgbA1c 4.9, goal < 7.0 CBGs SSI  Dissociated p waves on EKG Repeat EKG 02/21/2022 -sinus bradycardia with marked sinus arrhythmia with first-degree AV block Echo EF 60 to 65%  Other Stroke Risk Factors Advanced  Age >/= 79  Former cigarette smoker  Other Active Problems Hypothyroidism: Synthroid 88 mcg ESRD and Anemia of ESRD: HD per nephrology  Prostate cancer Lower back pain  Hospital day # 0  Signed: Merrily Brittle, DO Resident, PGY-1 [place] 02/21/2022, 2:23 PM   To contact Stroke Continuity provider, please refer to [email or web address] After hours, contact General Neurology

## 2022-02-21 NOTE — H&P (View-Only) (Signed)
?  Progress Note ? ? ? ?02/21/2022 ?3:09 PM ? ?Subjective: No overnight issues ? ?Vitals:  ? 02/21/22 0341 02/21/22 0733  ?BP: 133/60 (!) 148/62  ?Pulse: 67 64  ?Resp: 18 18  ?Temp:    ?SpO2: 95% 96%  ? ? ?Physical Exam: ?Awake alert oriented ?Moving all extremities without limitation ? ? ?CBC ?   ?Component Value Date/Time  ? WBC 7.5 02/20/2022 1048  ? RBC 3.04 (L) 02/20/2022 1048  ? HGB 10.5 (L) 02/20/2022 1048  ? HCT 33.0 (L) 02/20/2022 1048  ? PLT 257 02/20/2022 1048  ? MCV 108.6 (H) 02/20/2022 1048  ? MCH 34.5 (H) 02/20/2022 1048  ? MCHC 31.8 02/20/2022 1048  ? RDW 14.4 02/20/2022 1048  ? ? ?BMET ?   ?Component Value Date/Time  ? NA 141 02/20/2022 1048  ? NA 141 02/20/2022 1048  ? K 4.2 02/20/2022 1048  ? K 4.3 02/20/2022 1048  ? CL 102 02/20/2022 1048  ? CL 101 02/20/2022 1048  ? CO2 26 02/20/2022 1048  ? CO2 26 02/20/2022 1048  ? GLUCOSE 131 (H) 02/20/2022 1048  ? GLUCOSE 120 (H) 02/20/2022 1048  ? BUN 57 (H) 02/20/2022 1048  ? BUN 58 (H) 02/20/2022 1048  ? CREATININE 9.67 (H) 02/20/2022 1048  ? CREATININE 9.65 (H) 02/20/2022 1048  ? CALCIUM 8.9 02/20/2022 1048  ? CALCIUM 8.9 02/20/2022 1048  ? GFRNONAA 5 (L) 02/20/2022 1048  ? GFRNONAA 5 (L) 02/20/2022 1048  ? ? ?INR ?No results found for: INR ? ?No intake or output data in the 24 hours ending 02/21/22 1509 ? ? ?Assessment/plan:  88 y.o. male is here with bilateral ICA stenosis left greater than right with very tight stenosis on the left with soft and calcified plaque.  There is also an MRI lesion on the left to consider this to be a symptomatic lesion although patient is now without further symptoms.  He is on aspirin.  Appears to be a good candidate for left carotid endarterectomy.  I have discussed with Dr. Erlinda Hong with neurology and we will schedule this as an outpatient in the next 1 to 2 weeks. ? ? ?Sinda Leedom C. Donzetta Matters, MD ?[facility] ?Office: [phone] ?Pager: [phone] ? ?02/21/2022 ?3:09 PM ? ?

## 2022-02-21 NOTE — Progress Notes (Addendum)
?  Progress Note ? ? ? ?02/21/2022 ?3:09 PM ? ?Subjective: No overnight issues ? ?Vitals:  ? 02/21/22 0341 02/21/22 0733  ?BP: 133/60 (!) 148/62  ?Pulse: 67 64  ?Resp: 18 18  ?Temp:    ?SpO2: 95% 96%  ? ? ?Physical Exam: ?Awake alert oriented ?Moving all extremities without limitation ? ? ?CBC ?   ?Component Value Date/Time  ? WBC 7.5 02/20/2022 1048  ? RBC 3.04 (L) 02/20/2022 1048  ? HGB 10.5 (L) 02/20/2022 1048  ? HCT 33.0 (L) 02/20/2022 1048  ? PLT 257 02/20/2022 1048  ? MCV 108.6 (H) 02/20/2022 1048  ? MCH 34.5 (H) 02/20/2022 1048  ? MCHC 31.8 02/20/2022 1048  ? RDW 14.4 02/20/2022 1048  ? ? ?BMET ?   ?Component Value Date/Time  ? NA 141 02/20/2022 1048  ? NA 141 02/20/2022 1048  ? K 4.2 02/20/2022 1048  ? K 4.3 02/20/2022 1048  ? CL 102 02/20/2022 1048  ? CL 101 02/20/2022 1048  ? CO2 26 02/20/2022 1048  ? CO2 26 02/20/2022 1048  ? GLUCOSE 131 (H) 02/20/2022 1048  ? GLUCOSE 120 (H) 02/20/2022 1048  ? BUN 57 (H) 02/20/2022 1048  ? BUN 58 (H) 02/20/2022 1048  ? CREATININE 9.67 (H) 02/20/2022 1048  ? CREATININE 9.65 (H) 02/20/2022 1048  ? CALCIUM 8.9 02/20/2022 1048  ? CALCIUM 8.9 02/20/2022 1048  ? GFRNONAA 5 (L) 02/20/2022 1048  ? GFRNONAA 5 (L) 02/20/2022 1048  ? ? ?INR ?No results found for: INR ? ?No intake or output data in the 24 hours ending 02/21/22 1509 ? ? ?Assessment/plan:  88 y.o. male is here with bilateral ICA stenosis left greater than right with very tight stenosis on the left with soft and calcified plaque.  There is also an MRI lesion on the left to consider this to be a symptomatic lesion although patient is now without further symptoms.  He is on aspirin.  Appears to be a good candidate for left carotid endarterectomy.  I have discussed with Dr. Erlinda Hong with neurology and we will schedule this as an outpatient in the next 1 to 2 weeks. ? ? ?Aja Whitehair C. Donzetta Matters, MD ?[facility] ?Office: [phone] ?Pager: [phone] ? ?02/21/2022 ?3:09 PM ? ?

## 2022-02-21 NOTE — ED Notes (Signed)
Dialysis consent signed, report called to dialysis ?

## 2022-02-21 NOTE — Progress Notes (Signed)
?  Echocardiogram ?2D Echocardiogram has been performed. ? ?Noah Osborn ?02/21/2022, 4:22 PM ?

## 2022-02-21 NOTE — Evaluation (Signed)
Occupational Therapy Evaluation ?Patient Details ?Name: Noah Osborn ?MRN: [id number] ?DOB: [birth year] ?Today's Date: 02/21/2022 ? ? ?History of Present Illness Pt is a 88 y/o male admitted secondary to dizziness. Imaging showed L parietal infarct. PMH includes ESRD on dialysis MWF, anemia of CKD, HLD, HTN, hx of prostate cancer, T2DM  ? ?Clinical Impression ?  ?PTA patient independent and driving, has assist from family for IADLs.  Admitted for above and presents with problem list below, including weakness, impaired balance, dizziness and decreased activity tolerance. He currently requires +2 min assist for transfers, limited in room mobility, min guard for seated UB ADLs and mod assist for LB ADLs (dizzy with downward gaze). Based on performance today, believe he will benefit from continued OT services while admitted and after dc at [facility] level, given 24/7 support to optimize independence, safety and return to PLOF.  ?   ? ?Recommendations for follow up therapy are one component of a multi-disciplinary discharge planning process, led by the attending physician.  Recommendations may be updated based on patient status, additional functional criteria and insurance authorization.  ? ?Follow Up Recommendations ? Home health OT  ?  ?Assistance Recommended at Discharge Frequent or constant Supervision/Assistance  ?Patient can return home with the following A little help with walking and/or transfers;A little help with bathing/dressing/bathroom;Direct supervision/assist for medications management;Assistance with cooking/housework;Direct supervision/assist for financial management;Assist for transportation;Help with stairs or ramp for entrance ? ?  ?Functional Status Assessment ? Patient has had a recent decline in their functional status and demonstrates the ability to make significant improvements in function in a reasonable and predictable amount of time.  ?Equipment Recommendations ? BSC/3in1;Tub/shower seat  ?   ?Recommendations for Other Services   ? ? ?  ?Precautions / Restrictions Precautions ?Precautions: Fall ?Restrictions ?Weight Bearing Restrictions: No  ? ?  ? ?Mobility Bed Mobility ?Overal bed mobility: Needs Assistance ?Bed Mobility: Supine to Sit, Sit to Supine ?  ?  ?Supine to sit: Min assist ?Sit to supine: Min assist ?  ?General bed mobility comments: Min A For trunk assist and LE assist. Upon sitting, pt with dizziness that resolved quickly; orthostatics negative. Increased dizziness upon return to supine and pt with slight nystagmus as well. ?  ? ?Transfers ?  ?  ?  ?  ?  ?  ?  ?  ?  ?  ?  ? ?  ?Balance Overall balance assessment: Needs assistance ?Sitting-balance support: No upper extremity supported, Feet supported ?Sitting balance-Leahy Scale: Fair ?  ?  ?Standing balance support: Bilateral upper extremity supported ?Standing balance-Leahy Scale: Poor ?Standing balance comment: Reliant on BUE support ?  ?  ?  ?  ?  ?  ?  ?  ?  ?  ?  ?   ? ?ADL either performed or assessed with clinical judgement  ? ?ADL Overall ADL's : Needs assistance/impaired ?  ?  ?Grooming: Min guard;Sitting ?  ?  ?  ?  ?  ?Upper Body Dressing : Min guard;Sitting ?  ?Lower Body Dressing: +2 for safety/equipment;Sit to/from stand;+2 for physical assistance;Moderate assistance ?Lower Body Dressing Details (indicate cue type and reason): dizzy with looking down to don socks and requires assist ?Toilet Transfer: Minimal assistance;+2 for safety/equipment;+2 for physical assistance ?  ?  ?  ?  ?  ?Functional mobility during ADLs: Minimal assistance;+2 for physical assistance;+2 for safety/equipment ?   ? ? ? ?Vision   ?Vision Assessment?: Yes ?Eye Alignment: Within Functional Limits ?Ocular Range of Motion:  Within Functional Limits ?Alignment/Gaze Preference: Within Defined Limits ?Tracking/Visual Pursuits: Able to track stimulus in all quads without difficulty ?Additional Comments: appears WFL, able to read clock. dizzy with looking down   ?   ?Perception   ?  ?Praxis   ?  ? ?Pertinent Vitals/Pain Pain Assessment ?Pain Assessment: No/denies pain  ? ? ? ?Hand Dominance   ?  ?Extremity/Trunk Assessment Upper Extremity Assessment ?Upper Extremity Assessment: Overall WFL for tasks assessed ?  ?Lower Extremity Assessment ?Lower Extremity Assessment: Defer to PT evaluation ?  ?Cervical / Trunk Assessment ?Cervical / Trunk Assessment: Normal ?  ?Communication Communication ?Communication: HOH ?  ?Cognition Arousal/Alertness: Awake/alert ?Behavior During Therapy: [facility] for tasks assessed/performed ?Overall Cognitive Status: Within Functional Limits for tasks assessed ?  ?  ?  ?  ?  ?  ?  ?  ?  ?  ?  ?  ?  ?  ?  ?  ?  ?  ?  ?General Comments  granddaughter in room and supportive ? ?  ?Exercises   ?  ?Shoulder Instructions    ? ? ?Home Living Family/patient expects to be discharged to:: Private residence ?Living Arrangements: Other relatives (grandson stays with him at night) ?Available Help at Discharge: Family ?Type of Home: House ?Home Access: Stairs to enter ?Entrance Stairs-Number of Steps: 3 ?Entrance Stairs-Rails: Right ?Home Layout: One level ?  ?  ?Bathroom Shower/Tub: Tub/shower unit ?  ?Bathroom Toilet: Standard ?  ?  ?Home Equipment: Kasandra Knudsen - single point ?  ?  ?  ? ?  ?Prior Functioning/Environment Prior Level of Function : Independent/Modified Independent;Driving ?  ?  ?  ?  ?  ?  ?  ?ADLs Comments: Daughter in law cooks for him ?  ? ?  ?  ?OT Problem List: Decreased strength;Decreased activity tolerance;Impaired balance (sitting and/or standing);Decreased safety awareness;Decreased knowledge of use of DME or AE;Decreased knowledge of precautions ?  ?   ?OT Treatment/Interventions: Self-care/ADL training;Therapeutic exercise;DME and/or AE instruction;Therapeutic activities;Balance training;Patient/family education  ?  ?OT Goals(Current goals can be found in the care plan section) Acute Rehab OT Goals ?Patient Stated Goal: home ?OT Goal Formulation:  With patient ?Time For Goal Achievement: 03/07/22 ?Potential to Achieve Goals: Good ?ADL Goals ?Pt Will Perform Grooming: with modified independence;standing ?Pt Will Perform Lower Body Dressing: with modified independence;sit to/from stand ?Pt Will Transfer to Toilet: with modified independence;ambulating ?Pt Will Perform Toileting - Clothing Manipulation and hygiene: with modified independence;sit to/from stand ?Pt Will Perform Tub/Shower Transfer: Tub transfer;with supervision;shower seat;ambulating;rolling walker  ?OT Frequency: Min 2X/week ?  ? ?Co-evaluation PT/OT/SLP Co-Evaluation/Treatment: Yes ?Reason for Co-Treatment: For patient/therapist safety;To address functional/ADL transfers ?PT goals addressed during session: Mobility/safety with mobility;Balance ?OT goals addressed during session: ADL's and self-care ?  ? ?  ?AM-PAC OT "6 Clicks" Daily Activity     ?Outcome Measure Help from another person eating meals?: A Little ?Help from another person taking care of personal grooming?: A Little ?Help from another person toileting, which includes using toliet, bedpan, or urinal?: A Lot ?Help from another person bathing (including washing, rinsing, drying)?: A Little ?Help from another person to put on and taking off regular upper body clothing?: A Little ?Help from another person to put on and taking off regular lower body clothing?: A Lot ?6 Click Score: 16 ?  ?End of Session Equipment Utilized During Treatment: Gait belt ?Nurse Communication: Mobility status ? ?Activity Tolerance: Patient tolerated treatment well ?Patient left: in bed;with call bell/phone within reach;with family/visitor present ? ?  OT Visit Diagnosis: Other abnormalities of gait and mobility (R26.89);Unsteadiness on feet (R26.81);Dizziness and giddiness (R42);Muscle weakness (generalized) (M62.81)  ?              ?Time: 7741-4239 ?OT Time Calculation (min): 26 min ?Charges:  OT General Charges ?$OT Visit: 1 Visit ?OT Evaluation ?$OT Eval  Moderate Complexity: 1 Mod ? ?Jolaine Artist, OT ?Acute Rehabilitation Services ?Pager [phone] ?Office [phone] ? ? ?Delight Stare ?02/21/2022, 1:40 PM ?

## 2022-02-21 NOTE — ED Notes (Signed)
Breakfast Order placed ?

## 2022-02-21 NOTE — ED Notes (Signed)
To dialysis

## 2022-02-21 NOTE — Evaluation (Signed)
Physical Therapy Evaluation ?Patient Details ?Name: Noah Osborn ?MRN: [id number] ?DOB: 10/02/1924 ?Today's Date: 02/21/2022 ? ?History of Present Illness ? Pt is a 88 y/o male admitted secondary to dizziness. Imaging showed L parietal infarct. PMH includes ESRD on dialysis MWF, anemia of CKD, HLD, HTN, hx of prostate cancer, T2DM  ?Clinical Impression ? Pt admitted secondary to problem above with deficits below. Requiring min A  for steadying secondary to dizziness with position changes and when performing head turns; especially when looking down. Orthostatics negative. Pt with + dix hallpike to the R and treated accordingly. Pt tolerated well. Would benefit from further vestibular follow up while admitted acutely. Family reports they can assist as needed at d/c. Will continue to follow acutely.  ?   ? ?Recommendations for follow up therapy are one component of a multi-disciplinary discharge planning process, led by the attending physician.  Recommendations may be updated based on patient status, additional functional criteria and insurance authorization. ? ?Follow Up Recommendations Home health PT ? ?  ?Assistance Recommended at Discharge Frequent or constant Supervision/Assistance (initially)  ?Patient can return home with the following ? A little help with walking and/or transfers;A little help with bathing/dressing/bathroom;Assistance with cooking/housework;Help with stairs or ramp for entrance;Assist for transportation ? ?  ?Equipment Recommendations Rolling walker (2 wheels);Other (comment) (shower chair)  ?Recommendations for Other Services ?    ?  ?Functional Status Assessment Patient has had a recent decline in their functional status and demonstrates the ability to make significant improvements in function in a reasonable and predictable amount of time.  ? ?  ?Precautions / Restrictions Precautions ?Precautions: Fall ?Restrictions ?Weight Bearing Restrictions: No  ? ?  ? ?Mobility ? Bed Mobility ?Overal  bed mobility: Needs Assistance ?Bed Mobility: Supine to Sit, Sit to Supine ?  ?  ?Supine to sit: Min assist ?Sit to supine: Min assist ?  ?General bed mobility comments: Min A For trunk assist and LE assist. Upon sitting, pt with dizziness that resolved quickly; orthostatics negative. Increased dizziness upon return to supine and pt with slight nystagmus as well. ?  ? ?Transfers ?Overall transfer level: Needs assistance ?Equipment used: 2 person hand held assist ?Transfers: Sit to/from Stand ?Sit to Stand: Min assist, +2 physical assistance, +2 safety/equipment ?  ?  ?  ?  ?  ?General transfer comment: Min A for lift assist and steadying. ?  ? ?Ambulation/Gait ?Ambulation/Gait assistance: Min guard ?Gait Distance (Feet): 10 Feet ?Assistive device: 2 person hand held assist ?Gait Pattern/deviations: Step-through pattern, Decreased stride length ?Gait velocity: Decreased ?  ?  ?General Gait Details: took steps forwards and backwards at EOB. mild unsteadiness. Educated about using RW at home for increased safety. ? ?Stairs ?  ?  ?  ?  ?  ? ?Wheelchair Mobility ?  ? ?Modified Rankin (Stroke Patients Only) ?  ? ?  ? ?Balance Overall balance assessment: Needs assistance ?Sitting-balance support: No upper extremity supported, Feet supported ?Sitting balance-Leahy Scale: Fair ?  ?  ?Standing balance support: Bilateral upper extremity supported ?Standing balance-Leahy Scale: Poor ?Standing balance comment: Reliant on BUE support ?  ?  ?  ?  ?  ?  ?  ?  ?  ?  ?  ?   ? ? ? ?Pertinent Vitals/Pain Pain Assessment ?Pain Assessment: No/denies pain  ? ? ?Home Living Family/patient expects to be discharged to:: Private residence ?Living Arrangements: Other relatives (grandson stays with him at night) ?Available Help at Discharge: Family ?Type of Home: House ?  Home Access: Stairs to enter ?Entrance Stairs-Rails: Right ?Entrance Stairs-Number of Steps: 3 ?  ?Home Layout: One level ?Home Equipment: Kasandra Knudsen - single point ?   ?  ?Prior  Function Prior Level of Function : Independent/Modified Independent;Driving ?  ?  ?  ?  ?  ?  ?  ?ADLs Comments: Daughter in law cooks for him ?  ? ? ?Hand Dominance  ?   ? ?  ?Extremity/Trunk Assessment  ? Upper Extremity Assessment ?Upper Extremity Assessment: Defer to OT evaluation ?  ? ?Lower Extremity Assessment ?Lower Extremity Assessment: Generalized weakness ?  ? ?Cervical / Trunk Assessment ?Cervical / Trunk Assessment: Normal  ?Communication  ? Communication: HOH  ?Cognition Arousal/Alertness: Awake/alert ?Behavior During Therapy: [facility] for tasks assessed/performed ?Overall Cognitive Status: Within Functional Limits for tasks assessed ?  ?  ?  ?  ?  ?  ?  ?  ?  ?  ?  ?  ?  ?  ?  ?  ?  ?  ?  ? ?  ?General Comments General comments (skin integrity, edema, etc.): Pt with nystagmus with dix hallpike to the R; was able to perform modified epley on stretcher. Pt tolerated well and reported mild wooziness following ? ?  ?Exercises    ? ?Assessment/Plan  ?  ?PT Assessment Patient needs continued PT services  ?PT Problem List Decreased strength;Decreased activity tolerance;Decreased balance;Decreased mobility ? ?   ?  ?PT Treatment Interventions DME instruction;Gait training;Stair training;Functional mobility training;Therapeutic activities;Therapeutic exercise;Balance training;Patient/family education   ? ?PT Goals (Current goals can be found in the Care Plan section)  ?Acute Rehab PT Goals ?Patient Stated Goal: to go home ?PT Goal Formulation: With patient ?Time For Goal Achievement: 03/07/22 ?Potential to Achieve Goals: Good ? ?  ?Frequency Min 3X/week ?  ? ? ?Co-evaluation PT/OT/SLP Co-Evaluation/Treatment: Yes ?Reason for Co-Treatment: For patient/therapist safety;To address functional/ADL transfers ?PT goals addressed during session: Mobility/safety with mobility;Balance ?  ?  ? ? ?  ?AM-PAC PT "6 Clicks" Mobility  ?Outcome Measure Help needed turning from your back to your side while in a flat bed without  using bedrails?: A Little ?Help needed moving from lying on your back to sitting on the side of a flat bed without using bedrails?: A Little ?Help needed moving to and from a bed to a chair (including a wheelchair)?: A Little ?Help needed standing up from a chair using your arms (e.g., wheelchair or bedside chair)?: A Little ?Help needed to walk in hospital room?: A Little ?Help needed climbing 3-5 steps with a railing? : A Lot ?6 Click Score: 17 ? ?  ?End of Session Equipment Utilized During Treatment: Gait belt ?Activity Tolerance: Patient tolerated treatment well ?Patient left: in bed;with call bell/phone within reach;with family/visitor present (on stretcher in ED) ?Nurse Communication: Mobility status ?PT Visit Diagnosis: Unsteadiness on feet (R26.81);Muscle weakness (generalized) (M62.81);Difficulty in walking, not elsewhere classified (R26.2);Dizziness and giddiness (R42) ?  ? ?Time: 7262-0355 ?PT Time Calculation (min) (ACUTE ONLY): 25 min ? ? ?Charges:   PT Evaluation ?$PT Eval Moderate Complexity: 1 Mod ?  ?  ?   ? ? ?Reuel Derby, PT, DPT  ?Acute Rehabilitation Services  ?Pager: [phone] ?Office: [phone] ? ? ?[place] ?02/21/2022, 12:17 PM ?

## 2022-02-21 NOTE — Progress Notes (Signed)
PROGRESS NOTE    Noah Osborn  NFA:[id number] DOB: 05/18/1924 DOA: 02/20/2022 PCP: Nicoletta Dress, MD   Brief Narrative:  HPI: Noah Osborn is a 88 y.o. male with medical history significant of ESRD on dialysis MWF, anemia of CKD, HLD, HTN, hypothyroidism, chronic back pain, hx of prostate cancer, T2DM  presenting to ED with intermittent dizziness that was persistent today and prompted him to come to ED. He started to feel dizzy on Thursday. He felt dizzy with standing and then the dizziness resolved. Friday at dialysis he was dizzy again in the morning with getting out of the bed and then resolved. Over the weekend the dizziness seemed to persist and was worse today. He feels like the world is spinning and he is standing still. He is mildly dizzy right now. Denies tinnitus. Is hard of hearing.    He denies any fever/chills, no headaches, no vision changes, chest pain or palpitations, shortness of breath or cough, no stomach pain, No N/V/D, does not make urine, no leg swelling.    ER Course:  vitals: afebrile, bp: 168/75, HR: 66, RR: 18, oxygen 99%RA Pertinent labs: hgb: 10.5, mcv: 108, BUN: 57, creatinine: 9.67,  CTH: no acute finding  MRI brain: punctate focus of apparent DWI hyperintensity in the high left parietal white matter may represent a tiny/subacute infarct or artifact. Abnormal flow w/in the proximal left intradural vertebral artery, ? Stenosis/artifact.  In ED: neurology consulted. CTA head/neck ordered.     Assessment & Plan:   Principal Problem:   Dizziness Active Problems:   End stage renal disease (HCC)   Bilateral carotid artery stenosis   Hypothyroidism, unspecified   Hyperlipidemia   Anemia in chronic kidney disease   Chronic low back pain   Acute ischemic stroke (HCC)  Dizziness/acute or subacute punctate infarct/CVA/bilateral internal carotid artery stenosis: Only complains of dizziness which has resolved.  No other weakness in any limb.  No focal  deficit on exam.  CTA shows no large vessel occlusion or proximal high-grade arterial stenosis however he has symptomatic left ICA stenosis with high-grade stenosis of the left greater than the right with mixed soft and calcified plaque.  Neurology on board.  Patient was on aspirin prior to admission but now also on Plavix.  Vascular surgery has seen the patient as well and we are waiting for them to clarify the neck steps in the plan.  Seen by PT OT recommends home health PT OT.  Patient on statin as well.  Will allow permissive hypertension.  He is not on any antihypertensives even prior to admission.   End stage renal disease ([place]) on MWF HD: Nephrology on board.  Hypertension-resolved as of 02/20/2022 Will allow for permissive HTn in setting of possible acute CVA and hx of possible orthostasis    Acquired hypothyroidism: Resume Synthroid.   Hyperlipidemia: Crestor.  Anemia in chronic kidney disease: Stable.  DVT prophylaxis: heparin injection 5,000 Units Start: 02/20/22 1700   Code Status: Full Code  Family Communication:  None present at bedside.  Plan of care discussed with patient in length and he/she verbalized understanding and agreed with it.  Status is: Inpatient Remains inpatient appropriate because: Awaiting further vascular surgery plans.   Estimated body mass index is 28.32 kg/m as calculated from the following:   Height as of this encounter: '5\' 9"'$  (1.753 m).   Weight as of this encounter: 87 kg.    Nutritional Assessment: Body mass index is 28.32 kg/m.Marland Kitchen Seen by dietician.  I agree with the assessment and plan as outlined below: Nutrition Status:        . Skin Assessment: I have examined the patient's skin and I agree with the wound assessment as performed by the wound care RN as outlined below:    Consultants:  Neurology Vascular surgery Procedures:  As above  Antimicrobials:  Anti-infectives (From admission, onward)    None          Subjective: Patient seen and examined in the ED this morning.  He was fully alert and oriented and had no complaints.  Denied any dizziness either.  Objective: Vitals:   02/20/22 2035 02/20/22 2230 02/21/22 0341 02/21/22 0733  BP: 135/62 (!) 119/59 133/60 (!) 148/62  Pulse: 67 63 67 64  Resp: '18 17 18 18  '$ Temp:      SpO2: 97% 98% 95% 96%  Weight:      Height:       No intake or output data in the 24 hours ending 02/21/22 1426 Filed Weights   02/20/22 1038  Weight: 87 kg    Examination:  General exam: Appears calm and comfortable  Respiratory system: Clear to auscultation. Respiratory effort normal. Cardiovascular system: S1 & S2 heard, RRR. No JVD, murmurs, rubs, gallops or clicks. No pedal edema. Gastrointestinal system: Abdomen is nondistended, soft and nontender. No organomegaly or masses felt. Normal bowel sounds heard. Central nervous system: Alert and oriented. No focal neurological deficits. Extremities: Symmetric 5 x 5 power. Skin: No rashes, lesions or ulcers Psychiatry: Judgement and insight appear normal. Mood & affect appropriate.    Data Reviewed: I have personally reviewed following labs and imaging studies  CBC: Recent Labs  Lab 02/20/22 1048  WBC 7.5  HGB 10.5*  HCT 33.0*  MCV 108.6*  PLT 956   Basic Metabolic Panel: Recent Labs  Lab 02/20/22 1048  NA 141   141  K 4.3   4.2  CL 101   102  CO2 26   26  GLUCOSE 120*   131*  BUN 58*   57*  CREATININE 9.65*   9.67*  CALCIUM 8.9   8.9  PHOS 6.1*   GFR: Estimated Creatinine Clearance: 5 mL/min (A) (by C-G formula based on SCr of 9.67 mg/dL (H)). Liver Function Tests: Recent Labs  Lab 02/20/22 1048  ALBUMIN 3.1*   No results for input(s): LIPASE, AMYLASE in the last 168 hours. No results for input(s): AMMONIA in the last 168 hours. Coagulation Profile: No results for input(s): INR, PROTIME in the last 168 hours. Cardiac Enzymes: No results for input(s): CKTOTAL, CKMB, CKMBINDEX,  TROPONINI in the last 168 hours. BNP (last 3 results) No results for input(s): PROBNP in the last 8760 hours. HbA1C: Recent Labs    02/20/22 1048  HGBA1C 4.9   CBG: No results for input(s): GLUCAP in the last 168 hours. Lipid Profile: Recent Labs    02/20/22 1048  CHOL 111  HDL 56  LDLCALC 44  TRIG 53  CHOLHDL 2.0   Thyroid Function Tests: Recent Labs    02/20/22 1630  TSH 2.142   Anemia Panel: Recent Labs    02/20/22 1630  VITAMINB12 207  FOLATE 30.2   Sepsis Labs: No results for input(s): PROCALCITON, LATICACIDVEN in the last 168 hours.  Recent Results (from the past 240 hour(s))  Resp Panel by RT-PCR (Flu A&B, Covid) Nasopharyngeal Swab     Status: None   Collection Time: 02/20/22  3:45 PM   Specimen: Nasopharyngeal Swab; Nasopharyngeal(NP) swabs  in vial transport medium  Result Value Ref Range Status   SARS Coronavirus 2 by RT PCR NEGATIVE NEGATIVE Final    Comment: (NOTE) SARS-CoV-2 target nucleic acids are NOT DETECTED.  The SARS-CoV-2 RNA is generally detectable in upper respiratory specimens during the acute phase of infection. The lowest concentration of SARS-CoV-2 viral copies this assay can detect is 138 copies/mL. A negative result does not preclude SARS-Cov-2 infection and should not be used as the sole basis for treatment or other patient management decisions. A negative result may occur with  improper specimen collection/handling, submission of specimen other than nasopharyngeal swab, presence of viral mutation(s) within the areas targeted by this assay, and inadequate number of viral copies(<138 copies/mL). A negative result must be combined with clinical observations, patient history, and epidemiological information. The expected result is Negative.  Fact Sheet for Patients:  EntrepreneurPulse.com.au  Fact Sheet for Healthcare Providers:  IncredibleEmployment.be  This test is no t yet approved or  cleared by the [place] FDA and  has been authorized for detection and/or diagnosis of SARS-CoV-2 by FDA under an Emergency Use Authorization (EUA). This EUA will remain  in effect (meaning this test can be used) for the duration of the COVID-19 declaration under Section 564(b)(1) of the Act, 21 U.S.C.section 360bbb-3(b)(1), unless the authorization is terminated  or revoked sooner.       Influenza A by PCR NEGATIVE NEGATIVE Final   Influenza B by PCR NEGATIVE NEGATIVE Final    Comment: (NOTE) The Xpert Xpress SARS-CoV-2/FLU/RSV plus assay is intended as an aid in the diagnosis of influenza from Nasopharyngeal swab specimens and should not be used as a sole basis for treatment. Nasal washings and aspirates are unacceptable for Xpert Xpress SARS-CoV-2/FLU/RSV testing.  Fact Sheet for Patients: EntrepreneurPulse.com.au  Fact Sheet for Healthcare Providers: IncredibleEmployment.be  This test is not yet approved or cleared by the [place] FDA and has been authorized for detection and/or diagnosis of SARS-CoV-2 by FDA under an Emergency Use Authorization (EUA). This EUA will remain in effect (meaning this test can be used) for the duration of the COVID-19 declaration under Section 564(b)(1) of the Act, 21 U.S.C. section 360bbb-3(b)(1), unless the authorization is terminated or revoked.  Performed at [facility], [place] [address] [place], [place] [address]      Radiology Studies: CT ANGIO HEAD NECK W WO CM  Result Date: 02/20/2022 CLINICAL DATA:  Stroke/TIA, determine embolic source. Additional history provided: Dizziness. EXAM: CT ANGIOGRAPHY HEAD AND NECK TECHNIQUE: Multidetector CT imaging of the head and neck was performed using the standard protocol during bolus administration of intravenous contrast. Multiplanar CT image reconstructions and MIPs were obtained to evaluate the vascular anatomy. Carotid stenosis measurements  (when applicable) are obtained utilizing NASCET criteria, using the distal internal carotid diameter as the denominator. RADIATION DOSE REDUCTION: This exam was performed according to the departmental dose-optimization program which includes automated exposure control, adjustment of the mA and/or kV according to patient size and/or use of iterative reconstruction technique. CONTRAST:  53m OMNIPAQUE IOHEXOL 350 MG/ML SOLN COMPARISON:  Brain MRI 02/20/2022.  Head CT 02/20/2022. FINDINGS: CTA NECK FINDINGS Aortic arch: Standard aortic branching. Atherosclerotic plaque within the visualized aortic arch and proximal major branch vessels of the neck. No hemodynamically significant innominate or proximal subclavian artery stenosis. Right carotid system: CCA and ICA patent within the neck. Prominent soft and calcified plaque about the carotid bifurcation and within the proximal ICA as well as proximal ECA. Resultant stenosis at the  origin of the right ICA of greater than 70%. Severe, near occlusive stenosis at the origin of the ECA. Left carotid system: CCA and ICA patent within the neck. Advanced soft and calcified plaque about the carotid bifurcation and within the proximal ICA. Resultant severe, near occlusive stenosis at the origin of the left ICA. Mild calcified plaque is also present at the CCA origin. Partially retropharyngeal course of the ICA. Vertebral arteries: Vertebral arteries codominant and patent within the neck. Nonstenotic calcified plaque at the origin of both vessels. Skeleton: Cervicothoracic levocurvature. Partially imaged thoracic dextrocurvature more inferiorly. Advanced cervical spondylosis. C7-T1 grade 1 anterolisthesis. No acute bony abnormality or aggressive osseous lesion. Other neck: No neck mass or cervical lymphadenopathy. Upper chest: No consolidation within the imaged lung apices. Review of the MIP images confirms the above findings CTA HEAD FINDINGS Anterior circulation: The intracranial  internal carotid arteries are patent. Calcified plaque within both vessels. No significant stenosis of the intracranial right ICA. Mild-to-moderate narrowing of the paraclinoid left ICA. The M1 middle cerebral arteries are patent. No M2 proximal branch occlusion or high-grade proximal stenosis is identified. The anterior cerebral arteries are patent. Posterior circulation: The intracranial vertebral arteries are patent. Atherosclerotic plaque within both vessels with no more than mild stenosis. The basilar artery is patent. The posterior cerebral arteries are patent. Posterior communicating arteries are diminutive or absent bilaterally. Venous sinuses: Within the limitations of contrast timing, no convincing thrombus. Anatomic variants: As described. Review of the MIP images confirms the above findings IMPRESSION: CTA neck: 1. The common carotid and internal carotid arteries are patent within the neck. However, there is prominent soft and calcified plaque about the carotid bifurcations and within the proximal ICAs, bilaterally. Resultant severe, near-occlusive stenosis at the origin of the left ICA (greater than 90%). There is also severe stenosis at the origin of the right ICA (greater than 70%). Additionally, there is severe, near-occlusive stenosis at the origin of the right external carotid artery. 2. Vertebral arteries codominant and patent within the neck without significant stenosis. 3.  Aortic Atherosclerosis (ICD10-I70.0). 4. Advanced cervical spondylosis. 5. Cervicothoracic spinal curvature as described. 6. C7-T1 grade 1 anterolisthesis. CTA head: 1. No intracranial large vessel occlusion or proximal high-grade arterial stenosis. 2. Atherosclerotic plaque within the intracranial internal carotid arteries and vertebral arteries, with no more than mild stenosis at these sites. Electronically Signed   By: Kellie Simmering D.O.   On: 02/20/2022 16:01   CT HEAD WO CONTRAST  Result Date: 02/20/2022 CLINICAL DATA:   Dizziness EXAM: CT HEAD WITHOUT CONTRAST TECHNIQUE: Contiguous axial images were obtained from the base of the skull through the vertex without intravenous contrast. RADIATION DOSE REDUCTION: This exam was performed according to the departmental dose-optimization program which includes automated exposure control, adjustment of the mA and/or kV according to patient size and/or use of iterative reconstruction technique. COMPARISON:  None. FINDINGS: Brain: No acute intracranial findings are seen. There are no signs of bleeding. Cortical sulci are prominent. There is decreased density in periventricular white matter. Vascular: There are scattered arterial calcifications. Skull: No fracture is seen. Sinuses/Orbits: There is mild mucosal thickening in the ethmoid sinus. Postsurgical changes are noted in both optic globes, possibly previous cataract surgery. Other: None IMPRESSION: No acute intracranial findings are seen in noncontrast CT brain. Atrophy. Small-vessel disease. Mild chronic sinusitis. Electronically Signed   By: Elmer Picker M.D.   On: 02/20/2022 11:04   MR BRAIN WO CONTRAST  Result Date: 02/20/2022 EXAM: MRI HEAD WITHOUT CONTRAST TECHNIQUE:  Multiplanar, multiecho pulse sequences of the brain and surrounding structures were obtained without intravenous contrast. COMPARISON:  Same day CT head. FINDINGS: Brain: Punctate focus of apparent DWI hyperintensity in the high left parietal white matter (series 3, image 32) without definite ADC correlate. Mild-to-moderate scattered T2/FLAIR hyperintensities within the white matter, nonspecific but compatible with chronic microvascular ischemic disease. Cerebral atrophy with ex vacuo ventricular dilation. No evidence of acute hemorrhage, mass lesion, midline shift, or extra-axial fluid collection. Vascular: Abnormal flow void within the proximal left intradural vertebral artery. Otherwise, major arterial flow voids are maintained. Skull and upper cervical  spine: Normal marrow signal. Sinuses/Orbits: Clear visualized sinuses.  Unremarkable orbits. Other: No mastoid effusions IMPRESSION: 1. Punctate focus of apparent DWI hyperintensity in the high left parietal white matter may represent a tiny acute/subacute infarct or artifact. 2. Abnormal flow void within the proximal left intradural vertebral artery, potentially representing significant stenosis or artifact. A CTA could further evaluate if clinically indicated. 3. Chronic microvascular disease and cerebral atrophy (ICD10-G31.9). Electronically Signed   By: Margaretha Sheffield M.D.   On: 02/20/2022 13:53   VAS US CAROTID  Result Date: 02/20/2022 Carotid Arterial Duplex Study Patient Name:  Noah Osborn  Date of Exam:   02/20/2022 Medical Rec #: [id number]        Accession #:    [id number] Date of Birth: 1924-08-14       Patient Gender: M Patient Age:   74 years Exam Location[facility]  [facility] Procedure:      VAS US CAROTID Referring Phys: Orma Flaming --------------------------------------------------------------------------------  Indications:       Carotid artery disease and Dizziness. Limitations        Today's exam was limited due to the patient's respiratory                    variation. Comparison Study:  No prior study Performing Technologist: Maudry Mayhew MHA, RDMS, RVT, RDCS  Examination Guidelines: A complete evaluation includes B-mode imaging, spectral Doppler, color Doppler, and power Doppler as needed of all accessible portions of each vessel. Bilateral testing is considered an integral part of a complete examination. Limited examinations for reoccurring indications may be performed as noted.  Right Carotid Findings: +----------+--------+--------+--------+------------------+---------+             PSV cm/s EDV cm/s Stenosis Plaque Description Comments   +----------+--------+--------+--------+------------------+---------+  CCA Prox   76       11                                               +----------+--------+--------+--------+------------------+---------+  CCA Distal 71       12                calcific           Shadowing  +----------+--------+--------+--------+------------------+---------+  ICA Prox   257      60       40-59%   calcific           Shadowing  +----------+--------+--------+--------+------------------+---------+  ICA Distal 97       13                                              +----------+--------+--------+--------+------------------+---------+  ECA        190                        calcific           shadowing  +----------+--------+--------+--------+------------------+---------+ +----------+--------+-------+----------------+-------------------+             PSV cm/s EDV cms Describe         Arm Pressure (mmHG)  +----------+--------+-------+----------------+-------------------+  Subclavian 210              Multiphasic, WNL                      +----------+--------+-------+----------------+-------------------+ +---------+--------+--+--------+--+---------+  Vertebral PSV cm/s 70 EDV cm/s 15 Antegrade  +---------+--------+--+--------+--+---------+  Left Carotid Findings: +----------+--------+--------+--------+-------------------------+---------+             PSV cm/s EDV cm/s Stenosis Plaque Description        Comments   +----------+--------+--------+--------+-------------------------+---------+  CCA Prox   78       7                                                      +----------+--------+--------+--------+-------------------------+---------+  CCA Distal 125      13                                                     +----------+--------+--------+--------+-------------------------+---------+  ICA Prox   176      31       40-59%   calcific and heterogenous Shadowing  +----------+--------+--------+--------+-------------------------+---------+  ICA Distal 109      22                                                      +----------+--------+--------+--------+-------------------------+---------+  ECA        96                         calcific                  shadowing  +----------+--------+--------+--------+-------------------------+---------+ +----------+--------+--------+---------------------+-------------------+             PSV cm/s EDV cm/s Describe              Arm Pressure (mmHG)  +----------+--------+--------+---------------------+-------------------+  Subclavian 281               Monophasic, turbulent                      +----------+--------+--------+---------------------+-------------------+ +---------+--------+--+--------+--+---------+  Vertebral PSV cm/s 33 EDV cm/s 10 Antegrade  +---------+--------+--+--------+--+---------+   Summary: Right Carotid: Velocities in the right ICA are consistent with an upper range                40-59% stenosis. Left Carotid: Velocities in the left ICA are consistent with a 40-59% stenosis               by peak systolic velocity, upper range 1-39% stenosis by end  diastolic velocity. Vertebrals:  Bilateral vertebral arteries demonstrate antegrade flow. Subclavians: Left subclavian artery flow was disturbed. Normal flow hemodynamics              were seen in the right subclavian artery. *See table(s) above for measurements and observations.  Electronically signed by Servando Snare MD on 02/20/2022 at 7:06:59 PM.    Final     Scheduled Meds:  aspirin EC  81 mg Oral Daily   calcium acetate  1,334 mg Oral BID WC   Chlorhexidine Gluconate Cloth  6 each Topical Q0600   clopidogrel  75 mg Oral Daily   heparin  5,000 Units Subcutaneous Q8H   levothyroxine  88 mcg Oral QAC breakfast   lidocaine  1 patch Transdermal Q24H   rosuvastatin  20 mg Oral Daily   Continuous Infusions:  sodium chloride     sodium chloride       LOS: 0 days   Time spent: 35 minutes  Darliss Cheney, MD [facility]  02/21/2022, 2:26 PM   *Please note that this is a verbal dictation  therefore any spelling or grammatical errors are due to the "[employer] One" system interpretation.  Please page via [place] and do not message via secure chat for urgent patient care matters. Secure chat can be used for non urgent patient care matters.  How to contact the [facility] Attending or Consulting provider [place] or covering provider during after hours [place], for this patient?  Check the care team in [facility] and look for a) attending/consulting [facility] provider listed and b) the [facility] team listed. Page or secure chat [phone]. Log into [email or web address] and use [place] universal password to access. If you do not have the password, please contact the hospital operator. Locate the [facility] provider you are looking for under [facility] and page to a number that you can be directly reached. If you still have difficulty reaching the provider, please page the [facility] (Director on Call) for the Hospitalists listed on amion for assistance.

## 2022-02-21 NOTE — ED Notes (Signed)
This RN went in to check on the pt to ask if he would be able to give [place] a urine sample. Pt stated "he is a dialysis pt and he hardly ever makes urine anymore but if he was able to he would let us know." Noah Osborn Noah Osborn continue to monitor.  ?

## 2022-02-22 ENCOUNTER — Other Ambulatory Visit: Payer: Self-pay

## 2022-02-22 DIAGNOSIS — R42 Dizziness and giddiness: Secondary | ICD-10-CM | POA: Diagnosis not present

## 2022-02-22 LAB — CBC WITH DIFFERENTIAL/PLATELET
Abs Immature Granulocytes: 0.03 10*3/uL (ref 0.00–0.07)
Basophils Absolute: 0 10*3/uL (ref 0.0–0.1)
Basophils Relative: 0 %
Eosinophils Absolute: 0.2 10*3/uL (ref 0.0–0.5)
Eosinophils Relative: 4 %
HCT: 32.1 % — ABNORMAL LOW (ref 39.0–52.0)
Hemoglobin: 10.3 g/dL — ABNORMAL LOW (ref 13.0–17.0)
Immature Granulocytes: 1 %
Lymphocytes Relative: 25 %
Lymphs Abs: 1.4 10*3/uL (ref 0.7–4.0)
MCH: 33.7 pg (ref 26.0–34.0)
MCHC: 32.1 g/dL (ref 30.0–36.0)
MCV: 104.9 fL — ABNORMAL HIGH (ref 80.0–100.0)
Monocytes Absolute: 0.5 10*3/uL (ref 0.1–1.0)
Monocytes Relative: 8 %
Neutro Abs: 3.6 10*3/uL (ref 1.7–7.7)
Neutrophils Relative %: 62 %
Platelets: 239 10*3/uL (ref 150–400)
RBC: 3.06 MIL/uL — ABNORMAL LOW (ref 4.22–5.81)
RDW: 14.6 % (ref 11.5–15.5)
WBC: 5.7 10*3/uL (ref 4.0–10.5)
nRBC: 0.3 % — ABNORMAL HIGH (ref 0.0–0.2)

## 2022-02-22 LAB — HEPATITIS B SURFACE ANTIBODY, QUANTITATIVE: Hep B S AB Quant (Post): >1000 m[IU]/mL (ref >9.9)

## 2022-02-22 MED ORDER — CLOPIDOGREL BISULFATE 75 MG PO TABS: 75.0000 mg | ORAL_TABLET | Freq: Every day | ORAL | 0 refills | Status: AC

## 2022-02-22 NOTE — Discharge Summary (Signed)
PatientPhysician Discharge Summary  Noah Osborn:[id number] DOB: Mar 24, 1924 DOA: 02/20/2022  PCP: Nicoletta Dress, MD  Admit date: 02/20/2022 Discharge date: 02/22/2022 30 Day Unplanned Readmission Risk Score    Flowsheet Row ED to Hosp-Admission (Current) from 02/20/2022 in [facility]  30 Day Unplanned Readmission Risk Score (%) 23.57 Filed at 02/22/2022 0801       This score is the patient's risk of an unplanned readmission within 30 days of being discharged (0 -100%). The score is based on dignosis, age, lab data, medications, orders, and past utilization.   Low:  0-14.9   Medium: 15-21.9   High: 22-29.9   Extreme: 30 and above          Admitted From: Home Disposition: Home  Recommendations for Outpatient Follow-up:  Follow up with PCP in 1-2 weeks Please obtain BMP/CBC in one week Follow-up with vascular surgery for left carotid artery endarterectomy in 1 to 2 weeks Please follow up with your PCP on the following pending results: Unresulted Labs (From admission, onward)     Start     Ordered   02/20/22 1039  Urinalysis, Routine w reflex microscopic  Once,   STAT        02/20/22 [place]: Yes Equipment/Devices: None  Discharge Condition: Stable CODE STATUS: Full code Diet recommendation: Cardiac  Subjective: Seen and examined.  No complaints.  No dizziness.  No focal weakness.  He is ready to go home.  Brief/Interim Summary:  Noah Osborn is a 88 y.o. male with medical history significant of ESRD on dialysis MWF, anemia of CKD, HLD, HTN, hypothyroidism, chronic back pain, hx of prostate cancer, T2DM  presented to ED with intermittent dizziness which started 3 to 4 days ago that became persistent and prompted him to come to ED.  Denies tinnitus, fever, chills or any other complaint.  Upon arrival to ED, he was hemodynamically stable. CTH: no acute finding  MRI brain: punctate focus of apparent DWI hyperintensity in  the high left parietal white matter may represent a tiny/subacute infarct or artifact. Abnormal flow w/in the proximal left intradural vertebral artery, ? Stenosis/artifact.  Patient was admitted to hospital service, neurology was consulted.CTA shows no large vessel occlusion or proximal high-grade arterial stenosis however he has symptomatic left ICA stenosis with high-grade stenosis of the left greater than the right with mixed soft and calcified plaque. Patient was on aspirin prior to admission but now also on Plavix.  Vascular surgery has seen the patient as well and they have planned to do outpatient left carotid endarterectomy in 1 to 2 weeks.  Neurology is also okay with that plan.  According to neurology, Etiology for patient dizziness in the morning getting up from bed likely due to BPPV.  PT/OT recommend home health with maneuver training.  Patient stroke on MRI likely incidental finding, secondary to left ICA symptomatic stenosis.  They recommended discharging this patient on DAPT.  Patient's dizziness has resolved completely.  His blood pressure stable.  He is already on Crestor.  He is being discharged home with home health.  Discharge plan was discussed with patient and/or family member and they verbalized understanding and agreed with it.  Discharge Diagnoses:  Principal Problem:   Dizziness Active Problems:   End stage renal disease (HCC)   Bilateral carotid artery stenosis   Hypothyroidism, unspecified   Hyperlipidemia   Anemia in chronic kidney disease  Chronic low back pain   Acute ischemic stroke [facility])    Discharge Instructions   Allergies as of 02/22/2022       Reactions   Penicillins Anaphylaxis, Rash, Other (See Comments)   Fainted like Fainted like   Pravastatin Other (See Comments)   Simvastatin Other (See Comments)        Medication List     TAKE these medications    aspirin 81 MG EC tablet Take 81 mg by mouth daily.   b complex-C-folic acid 1 MG  capsule Take 1 capsule by mouth at bedtime.   calcium acetate 667 MG capsule Commonly known as: PHOSLO Take 1,334 mg by mouth 2 (two) times daily with a meal.   clopidogrel 75 MG tablet Commonly known as: PLAVIX Take 1 tablet (75 mg total) by mouth daily.   ethyl chloride spray 1 application. as directed. For use before Dialysis treatment   famotidine 40 MG tablet Commonly known as: PEPCID Take 1 tablet by mouth at bedtime as needed for heartburn or indigestion.   Fish Oil 1000 MG Caps Take 2,000 mg by mouth daily.   gabapentin 100 MG capsule Commonly known as: NEURONTIN Take 100 mg by mouth 3 (three) times daily.   heparin 1000 unit/mL Soln injection Heparin Sodium (Porcine) 1,000 Units/mL Systemic   levothyroxine 88 MCG tablet Commonly known as: SYNTHROID Take 88 mcg by mouth daily before breakfast.   lidocaine 5 % ointment Commonly known as: XYLOCAINE Apply 1 application. topically 2 (two) times daily as needed for mild pain.   linaclotide 290 MCG Caps capsule Commonly known as: LINZESS Take 290 mcg by mouth daily as needed (constipation).   melatonin 3 MG Tabs tablet Take 3 mg by mouth at bedtime as needed (sleep).   MIRCERA IJ Mircera   polyethylene glycol powder 17 GM/SCOOP powder Commonly known as: GLYCOLAX/MIRALAX Take 17 g by mouth 2 (two) times daily as needed for mild constipation.   rosuvastatin 20 MG tablet Commonly known as: CRESTOR Take 20 mg by mouth at bedtime.   senna-docusate 8.6-50 MG tablet Commonly known as: Senokot-S Take 2 tablets by mouth daily.   SENSIPAR PO Take 1 capsule by mouth daily.   sevelamer carbonate 800 MG tablet Commonly known as: RENVELA Take 1,600 mg by mouth with breakfast, with lunch, and with evening meal.   Thera-Gesic 0.5-15 % Crea Apply 1 application. topically 4 (four) times daily as needed (joint pain).        Follow-up Information     Nicoletta Dress, MD Follow up in 1 week(s).   Specialty:  Internal Medicine Contact information: [address] [place] [address] [phone]         Waynetta Sandy, MD Follow up in 1 week(s).   Specialties: Vascular Surgery, Cardiology Contact information: [place] [address] [phone]                Allergies  Allergen Reactions   Penicillins Anaphylaxis, Rash and Other (See Comments)    Fainted like Fainted like    Pravastatin Other (See Comments)   Simvastatin Other (See Comments)    Consultations: Neurology and vascular surgery   Procedures/Studies: CT ANGIO HEAD NECK W WO CM  Result Date: 02/20/2022 CLINICAL DATA:  Stroke/TIA, determine embolic source. Additional history provided: Dizziness. EXAM: CT ANGIOGRAPHY HEAD AND NECK TECHNIQUE: Multidetector CT imaging of the head and neck was performed using the standard protocol during bolus administration of intravenous contrast. Multiplanar CT  image reconstructions and MIPs were obtained to evaluate the vascular anatomy. Carotid stenosis measurements (when applicable) are obtained utilizing NASCET criteria, using the distal internal carotid diameter as the denominator. RADIATION DOSE REDUCTION: This exam was performed according to the departmental dose-optimization program which includes automated exposure control, adjustment of the mA and/or kV according to patient size and/or use of iterative reconstruction technique. CONTRAST:  26m OMNIPAQUE IOHEXOL 350 MG/ML SOLN COMPARISON:  Brain MRI 02/20/2022.  Head CT 02/20/2022. FINDINGS: CTA NECK FINDINGS Aortic arch: Standard aortic branching. Atherosclerotic plaque within the visualized aortic arch and proximal major branch vessels of the neck. No hemodynamically significant innominate or proximal subclavian artery stenosis. Right carotid system: CCA and ICA patent within the neck. Prominent soft and calcified plaque about the carotid bifurcation and within the proximal ICA as well as  proximal ECA. Resultant stenosis at the origin of the right ICA of greater than 70%. Severe, near occlusive stenosis at the origin of the ECA. Left carotid system: CCA and ICA patent within the neck. Advanced soft and calcified plaque about the carotid bifurcation and within the proximal ICA. Resultant severe, near occlusive stenosis at the origin of the left ICA. Mild calcified plaque is also present at the CCA origin. Partially retropharyngeal course of the ICA. Vertebral arteries: Vertebral arteries codominant and patent within the neck. Nonstenotic calcified plaque at the origin of both vessels. Skeleton: Cervicothoracic levocurvature. Partially imaged thoracic dextrocurvature more inferiorly. Advanced cervical spondylosis. C7-T1 grade 1 anterolisthesis. No acute bony abnormality or aggressive osseous lesion. Other neck: No neck mass or cervical lymphadenopathy. Upper chest: No consolidation within the imaged lung apices. Review of the MIP images confirms the above findings CTA HEAD FINDINGS Anterior circulation: The intracranial internal carotid arteries are patent. Calcified plaque within both vessels. No significant stenosis of the intracranial right ICA. Mild-to-moderate narrowing of the paraclinoid left ICA. The M1 middle cerebral arteries are patent. No M2 proximal branch occlusion or high-grade proximal stenosis is identified. The anterior cerebral arteries are patent. Posterior circulation: The intracranial vertebral arteries are patent. Atherosclerotic plaque within both vessels with no more than mild stenosis. The basilar artery is patent. The posterior cerebral arteries are patent. Posterior communicating arteries are diminutive or absent bilaterally. Venous sinuses: Within the limitations of contrast timing, no convincing thrombus. Anatomic variants: As described. Review of the MIP images confirms the above findings IMPRESSION: CTA neck: 1. The common carotid and internal carotid arteries are patent  within the neck. However, there is prominent soft and calcified plaque about the carotid bifurcations and within the proximal ICAs, bilaterally. Resultant severe, near-occlusive stenosis at the origin of the left ICA (greater than 90%). There is also severe stenosis at the origin of the right ICA (greater than 70%). Additionally, there is severe, near-occlusive stenosis at the origin of the right external carotid artery. 2. Vertebral arteries codominant and patent within the neck without significant stenosis. 3.  Aortic Atherosclerosis (ICD10-I70.0). 4. Advanced cervical spondylosis. 5. Cervicothoracic spinal curvature as described. 6. C7-T1 grade 1 anterolisthesis. CTA head: 1. No intracranial large vessel occlusion or proximal high-grade arterial stenosis. 2. Atherosclerotic plaque within the intracranial internal carotid arteries and vertebral arteries, with no more than mild stenosis at these sites. Electronically Signed   By: KKellie SimmeringD.O.   On: 02/20/2022 16:01   CT HEAD WO CONTRAST  Result Date: 02/20/2022 CLINICAL DATA:  Dizziness EXAM: CT HEAD WITHOUT CONTRAST TECHNIQUE: Contiguous axial images were obtained from the base of the skull through the vertex without  intravenous contrast. RADIATION DOSE REDUCTION: This exam was performed according to the departmental dose-optimization program which includes automated exposure control, adjustment of the mA and/or kV according to patient size and/or use of iterative reconstruction technique. COMPARISON:  None. FINDINGS: Brain: No acute intracranial findings are seen. There are no signs of bleeding. Cortical sulci are prominent. There is decreased density in periventricular white matter. Vascular: There are scattered arterial calcifications. Skull: No fracture is seen. Sinuses/Orbits: There is mild mucosal thickening in the ethmoid sinus. Postsurgical changes are noted in both optic globes, possibly previous cataract surgery. Other: None IMPRESSION: No acute  intracranial findings are seen in noncontrast CT brain. Atrophy. Small-vessel disease. Mild chronic sinusitis. Electronically Signed   By: Elmer Picker M.D.   On: 02/20/2022 11:04   MR BRAIN WO CONTRAST  Result Date: 02/20/2022 EXAM: MRI HEAD WITHOUT CONTRAST TECHNIQUE: Multiplanar, multiecho pulse sequences of the brain and surrounding structures were obtained without intravenous contrast. COMPARISON:  Same day CT head. FINDINGS: Brain: Punctate focus of apparent DWI hyperintensity in the high left parietal white matter (series 3, image 32) without definite ADC correlate. Mild-to-moderate scattered T2/FLAIR hyperintensities within the white matter, nonspecific but compatible with chronic microvascular ischemic disease. Cerebral atrophy with ex vacuo ventricular dilation. No evidence of acute hemorrhage, mass lesion, midline shift, or extra-axial fluid collection. Vascular: Abnormal flow void within the proximal left intradural vertebral artery. Otherwise, major arterial flow voids are maintained. Skull and upper cervical spine: Normal marrow signal. Sinuses/Orbits: Clear visualized sinuses.  Unremarkable orbits. Other: No mastoid effusions IMPRESSION: 1. Punctate focus of apparent DWI hyperintensity in the high left parietal white matter may represent a tiny acute/subacute infarct or artifact. 2. Abnormal flow void within the proximal left intradural vertebral artery, potentially representing significant stenosis or artifact. A CTA could further evaluate if clinically indicated. 3. Chronic microvascular disease and cerebral atrophy (ICD10-G31.9). Electronically Signed   By: Margaretha Sheffield M.D.   On: 02/20/2022 13:53   ECHOCARDIOGRAM COMPLETE  Result Date: 02/21/2022    ECHOCARDIOGRAM REPORT   Patient Name:   Noah Osborn Date of Exam: 02/21/2022 Medical Rec #:  [id number]       Height:       69.0 in Accession #:    [id number]      Weight:       191.8 lb Date of Birth:  December 21, 1923      BSA:           2.030 m Patient Age:    89 years        BP:           133/60 mmHg Patient Gender: M               HR:           77 bpm. Exam Location:  Inpatient Procedure: 2D Echo, Cardiac Doppler and Color Doppler Indications:    Stroke I63.9  History:        Patient has no prior history of Echocardiogram examinations.  Sonographer:    Bernadene Person RDCS Referring Phys: [id number] ALLISON WOLFE IMPRESSIONS  1. LV apical false tendon (normal variant). Left ventricular ejection fraction, by estimation, is 60 to 65%. Left ventricular ejection fraction by PLAX is 64 %. The left ventricle has normal function. The left ventricle has no regional wall motion abnormalities. There is mild left ventricular hypertrophy. Left ventricular diastolic parameters are consistent with Grade I diastolic dysfunction (impaired relaxation).  2. Right ventricular systolic function is normal. The  right ventricular size is normal. There is normal pulmonary artery systolic pressure. The estimated right ventricular systolic pressure is 89.3 mmHg.  3. Left atrial size was moderately dilated.  4. Right atrial size was mildly dilated.  5. The mitral valve is abnormal. Mild to moderate mitral valve regurgitation. Moderate mitral annular calcification.  6. AI is eccentric and posterior directed at the anterior leaflet of the mitral valve. The aortic valve is tricuspid. There is moderate calcification of the aortic valve. There is moderate thickening of the aortic valve. Aortic valve regurgitation is mild. Moderate to severe aortic valve stenosis. Aortic valve area, by VTI measures 1.04 cm. Aortic valve mean gradient measures 26.3 mmHg. Aortic valve Vmax measures 3.26 m/s.  7. Aortic dilatation noted. There is borderline dilatation of the ascending aorta, measuring 38 mm.  8. The inferior vena cava is normal in size with <50% respiratory variability, suggesting right atrial pressure of 8 mmHg.  9. Evidence of atrial level shunting detected by color flow Doppler.  There is a moderately sized patent foramen ovale with predominantly left to right shunting across the atrial septum. Comparison(s): No prior Echocardiogram. Conclusion(s)/Recommendation(s): Consider further evaluation with TEE/bubble study to evaluate the ASD/PFO. FINDINGS  Left Ventricle: LV apical false tendon (normal variant). Left ventricular ejection fraction, by estimation, is 60 to 65%. Left ventricular ejection fraction by PLAX is 64 %. The left ventricle has normal function. The left ventricle has no regional wall  motion abnormalities. The left ventricular internal cavity size was normal in size. There is mild left ventricular hypertrophy. Left ventricular diastolic parameters are consistent with Grade I diastolic dysfunction (impaired relaxation). Indeterminate filling pressures. Right Ventricle: The right ventricular size is normal. No increase in right ventricular wall thickness. Right ventricular systolic function is normal. There is normal pulmonary artery systolic pressure. The tricuspid regurgitant velocity is 2.58 m/s, and  with an assumed right atrial pressure of 8 mmHg, the estimated right ventricular systolic pressure is 81.0 mmHg. Left Atrium: Left atrial size was moderately dilated. Right Atrium: Right atrial size was mildly dilated. Pericardium: There is no evidence of pericardial effusion. Mitral Valve: The mitral valve is abnormal. There is mild thickening of the posterior and anterior mitral valve leaflet(s). There is moderate calcification of the posterior mitral valve leaflet(s). Moderate mitral annular calcification. Mild to moderate mitral valve regurgitation. Tricuspid Valve: The tricuspid valve is grossly normal. Tricuspid valve regurgitation is trivial. Aortic Valve: AI is eccentric and posterior directed at the anterior leaflet of the mitral valve. The aortic valve is tricuspid. There is moderate calcification of the aortic valve. There is moderate thickening of the aortic valve.  Aortic valve regurgitation is mild. Moderate to severe aortic stenosis is present. Aortic valve mean gradient measures 26.3 mmHg. Aortic valve peak gradient measures 42.4 mmHg. Aortic valve area, by VTI measures 1.04 cm. Pulmonic Valve: The pulmonic valve was normal in structure. Pulmonic valve regurgitation is not visualized. Aorta: Aortic dilatation noted. There is borderline dilatation of the ascending aorta, measuring 38 mm. Venous: The inferior vena cava is normal in size with less than 50% respiratory variability, suggesting right atrial pressure of 8 mmHg. IAS/Shunts: Evidence of atrial level shunting detected by color flow Doppler. A moderately sized patent foramen ovale is detected with predominantly left to right shunting across the atrial septum.  LEFT VENTRICLE PLAX 2D LV EF:         Left ventricular ejection fraction by PLAX is 64 %. LVIDd:  4.90 cm LVIDs:         3.20 cm LV PW:         1.40 cm LV IVS:        1.20 cm LVOT diam:     2.20 cm LV SV:         83 LV SV Index:   41 LVOT Area:     3.80 cm  LV Volumes (MOD) LV vol d, MOD A2C: 132.0 ml LV vol d, MOD A4C: 125.0 ml LV vol s, MOD A2C: 76.2 ml LV vol s, MOD A4C: 72.1 ml LV SV MOD A2C:     55.8 ml LV SV MOD A4C:     125.0 ml LV SV MOD BP:      52.6 ml RIGHT VENTRICLE RV S prime:     12.90 cm/s TAPSE (M-mode): 2.2 cm LEFT ATRIUM             Index        RIGHT ATRIUM           Index LA diam:        4.60 cm 2.27 cm/m   RA Area:     16.70 cm LA Vol (A2C):   79.0 ml 38.92 ml/m  RA Volume:   43.50 ml  21.43 ml/m LA Vol (A4C):   82.9 ml 40.85 ml/m LA Biplane Vol: 84.1 ml 41.44 ml/m  AORTIC VALVE AV Area (Vmax):    1.21 cm AV Area (Vmean):   1.08 cm AV Area (VTI):     1.04 cm AV Vmax:           325.67 cm/s AV Vmean:          246.667 cm/s AV VTI:            0.797 m AV Peak Grad:      42.4 mmHg AV Mean Grad:      26.3 mmHg LVOT Vmax:         103.50 cm/s LVOT Vmean:        70.233 cm/s LVOT VTI:          0.218 m LVOT/AV VTI ratio: 0.27  AORTA Ao  Root diam: 3.80 cm Ao Asc diam:  3.80 cm MITRAL VALVE                TRICUSPID VALVE MV Area (PHT): 3.42 cm     TR Peak grad:   26.6 mmHg MV Decel Time: 222 msec     TR Vmax:        258.00 cm/s MR PISA:        0.57 cm MR PISA Radius: 0.30 cm     SHUNTS MV E velocity: 141.00 cm/s  Systemic VTI:  0.22 m MV A velocity: 150.00 cm/s  Systemic Diam: 2.20 cm MV E/A ratio:  0.94 Lyman Bishop MD Electronically signed by Lyman Bishop MD Signature Date/Time: 02/21/2022/5:22:41 PM    Final    VAS US CAROTID  Result Date: 02/20/2022 Carotid Arterial Duplex Study Patient Name:  Noah Osborn  Date of Exam:   02/20/2022 Medical Rec #: [id number]        Accession #:    [id number] Date of Birth: 13-Jun-1924       Patient Gender: M Patient Age:   84 years Exam Location:  [facility] Procedure:      VAS US CAROTID Referring Phys: Orma Flaming --------------------------------------------------------------------------------  Indications:       Carotid artery disease and Dizziness. Limitations  Today's exam was limited due to the patient's respiratory                    variation. Comparison Study:  No prior study Performing Technologist: Maudry Mayhew MHA, RDMS, RVT, RDCS  Examination Guidelines: A complete evaluation includes B-mode imaging, spectral Doppler, color Doppler, and power Doppler as needed of all accessible portions of each vessel. Bilateral testing is considered an integral part of a complete examination. Limited examinations for reoccurring indications may be performed as noted.  Right Carotid Findings: +----------+--------+--------+--------+------------------+---------+             PSV cm/s EDV cm/s Stenosis Plaque Description Comments   +----------+--------+--------+--------+------------------+---------+  CCA Prox   76       11                                              +----------+--------+--------+--------+------------------+---------+  CCA Distal 71       12                calcific            Shadowing  +----------+--------+--------+--------+------------------+---------+  ICA Prox   257      60       40-59%   calcific           Shadowing  +----------+--------+--------+--------+------------------+---------+  ICA Distal 97       13                                              +----------+--------+--------+--------+------------------+---------+  ECA        190                        calcific           shadowing  +----------+--------+--------+--------+------------------+---------+ +----------+--------+-------+----------------+-------------------+             PSV cm/s EDV cms Describe         Arm Pressure (mmHG)  +----------+--------+-------+----------------+-------------------+  Subclavian 210              Multiphasic, WNL                      +----------+--------+-------+----------------+-------------------+ +---------+--------+--+--------+--+---------+  Vertebral PSV cm/s 70 EDV cm/s 15 Antegrade  +---------+--------+--+--------+--+---------+  Left Carotid Findings: +----------+--------+--------+--------+-------------------------+---------+             PSV cm/s EDV cm/s Stenosis Plaque Description        Comments   +----------+--------+--------+--------+-------------------------+---------+  CCA Prox   78       7                                                      +----------+--------+--------+--------+-------------------------+---------+  CCA Distal 125      13                                                     +----------+--------+--------+--------+-------------------------+---------+  ICA Prox   176      31       40-59%   calcific and heterogenous Shadowing  +----------+--------+--------+--------+-------------------------+---------+  ICA Distal 109      22                                                     +----------+--------+--------+--------+-------------------------+---------+  ECA        96                         calcific                  shadowing   +----------+--------+--------+--------+-------------------------+---------+ +----------+--------+--------+---------------------+-------------------+             PSV cm/s EDV cm/s Describe              Arm Pressure (mmHG)  +----------+--------+--------+---------------------+-------------------+  Subclavian 281               Monophasic, turbulent                      +----------+--------+--------+---------------------+-------------------+ +---------+--------+--+--------+--+---------+  Vertebral PSV cm/s 33 EDV cm/s 10 Antegrade  +---------+--------+--+--------+--+---------+   Summary: Right Carotid: Velocities in the right ICA are consistent with an upper range                40-59% stenosis. Left Carotid: Velocities in the left ICA are consistent with a 40-59% stenosis               by peak systolic velocity, upper range 1-39% stenosis by end               diastolic velocity. Vertebrals:  Bilateral vertebral arteries demonstrate antegrade flow. Subclavians: Left subclavian artery flow was disturbed. Normal flow hemodynamics              were seen in the right subclavian artery. *See table(s) above for measurements and observations.  Electronically signed by Servando Snare MD on 02/20/2022 at 7:06:59 PM.    Final      Discharge Exam: Vitals:   02/22/22 0318 02/22/22 0715  BP: (!) 149/82 125/64  Pulse: 66 73  Resp: 16 16  Temp: (!) 97.4 F (36.3 C) 98.7 F (37.1 C)  SpO2: 93% 96%   Vitals:   02/21/22 2020 02/22/22 0004 02/22/22 0318 02/22/22 0715  BP: (!) 165/84 (!) 147/70 (!) 149/82 125/64  Pulse: 75 78 66 73  Resp: '20 18 16 16  '$ Temp: 97.7 F (36.5 C) 98.3 F (36.8 C) (!) 97.4 F (36.3 C) 98.7 F (37.1 C)  TempSrc: Oral Oral Oral Oral  SpO2: 98% 93% 93% 96%  Weight:      Height:        General: Pt is alert, awake, not in acute distress Cardiovascular: RRR, S1/S2 +, no rubs, no gallops Respiratory: CTA bilaterally, no wheezing, no rhonchi Abdominal: Soft, NT, ND, bowel sounds  + Extremities: no edema, no cyanosis    The results of significant diagnostics from this hospitalization (including imaging, microbiology, ancillary and laboratory) are listed below for reference.     Microbiology: Recent Results (from the past 240 hour(s))  Resp Panel by RT-PCR (Flu A&B, Covid) Nasopharyngeal Swab     Status: None   Collection Time: 02/20/22  3:45 PM  Specimen: Nasopharyngeal Swab; Nasopharyngeal(NP) swabs in vial transport medium  Result Value Ref Range Status   SARS Coronavirus 2 by RT PCR NEGATIVE NEGATIVE Final    Comment: (NOTE) SARS-CoV-2 target nucleic acids are NOT DETECTED.  The SARS-CoV-2 RNA is generally detectable in upper respiratory specimens during the acute phase of infection. The lowest concentration of SARS-CoV-2 viral copies this assay can detect is 138 copies/mL. A negative result does not preclude SARS-Cov-2 infection and should not be used as the sole basis for treatment or other patient management decisions. A negative result may occur with  improper specimen collection/handling, submission of specimen other than nasopharyngeal swab, presence of viral mutation(s) within the areas targeted by this assay, and inadequate number of viral copies(<138 copies/mL). A negative result must be combined with clinical observations, patient history, and epidemiological information. The expected result is Negative.  Fact Sheet for Patients:  EntrepreneurPulse.com.au  Fact Sheet for Healthcare Providers:  IncredibleEmployment.be  This test is no t yet approved or cleared by the [place] FDA and  has been authorized for detection and/or diagnosis of SARS-CoV-2 by FDA under an Emergency Use Authorization (EUA). This EUA will remain  in effect (meaning this test can be used) for the duration of the COVID-19 declaration under Section 564(b)(1) of the Act, 21 U.S.C.section 360bbb-3(b)(1), unless the authorization  is terminated  or revoked sooner.       Influenza A by PCR NEGATIVE NEGATIVE Final   Influenza B by PCR NEGATIVE NEGATIVE Final    Comment: (NOTE) The Xpert Xpress SARS-CoV-2/FLU/RSV plus assay is intended as an aid in the diagnosis of influenza from Nasopharyngeal swab specimens and should not be used as a sole basis for treatment. Nasal washings and aspirates are unacceptable for Xpert Xpress SARS-CoV-2/FLU/RSV testing.  Fact Sheet for Patients: EntrepreneurPulse.com.au  Fact Sheet for Healthcare Providers: IncredibleEmployment.be  This test is not yet approved or cleared by the [place] FDA and has been authorized for detection and/or diagnosis of SARS-CoV-2 by FDA under an Emergency Use Authorization (EUA). This EUA will remain in effect (meaning this test can be used) for the duration of the COVID-19 declaration under Section 564(b)(1) of the Act, 21 U.S.C. section 360bbb-3(b)(1), unless the authorization is terminated or revoked.  Performed at [facility], [place] [address], [place], [place] [address]      Labs: BNP (last 3 results) No results for input(s): BNP in the last 8760 hours. Basic Metabolic Panel: Recent Labs  Lab 02/20/22 1048 02/21/22 1742  NA 141   141 137  K 4.3   4.2 4.5  CL 101   102 101  CO2 '26   26 23  '$ GLUCOSE 120*   131* 100*  BUN 58*   57* 72*  CREATININE 9.65*   9.67* 10.73*  CALCIUM 8.9   8.9 8.7*  PHOS 6.1* 8.1*   Liver Function Tests: Recent Labs  Lab 02/20/22 1048 02/21/22 1742  ALBUMIN 3.1* 2.7*   No results for input(s): LIPASE, AMYLASE in the last 168 hours. No results for input(s): AMMONIA in the last 168 hours. CBC: Recent Labs  Lab 02/20/22 1048 02/21/22 1742 02/22/22 0100  WBC 7.5 6.3 5.7  NEUTROABS  --   --  3.6  HGB 10.5* 9.8* 10.3*  HCT 33.0* 31.5* 32.1*  MCV 108.6* 107.5* 104.9*  PLT 257 250 239   Cardiac Enzymes: No results for input(s): CKTOTAL, CKMB,  CKMBINDEX, TROPONINI in the last 168 hours. BNP: Invalid input(s): POCBNP CBG: Recent Labs  Lab 02/21/22 2024  GLUCAP 108*   D-Dimer No results for input(s): DDIMER in the last 72 hours. Hgb A1c Recent Labs    02/20/22 1048  HGBA1C 4.9   Lipid Profile Recent Labs    02/20/22 1048  CHOL 111  HDL 56  LDLCALC 44  TRIG 53  CHOLHDL 2.0   Thyroid function studies Recent Labs    02/20/22 1630  TSH 2.142   Anemia work up Recent Labs    02/20/22 1630  VITAMINB12 207  FOLATE 30.2   Urinalysis No results found for: COLORURINE, APPEARANCEUR, LABSPEC, [place], GLUCOSEU, [place], [place], [place], PROTEINUR, UROBILINOGEN, NITRITE, LEUKOCYTESUR Sepsis Labs Invalid input(s): PROCALCITONIN,  WBC,  LACTICIDVEN Microbiology Recent Results (from the past 240 hour(s))  Resp Panel by RT-PCR (Flu A&B, Covid) Nasopharyngeal Swab     Status: None   Collection Time: 02/20/22  3:45 PM   Specimen: Nasopharyngeal Swab; Nasopharyngeal(NP) swabs in vial transport medium  Result Value Ref Range Status   SARS Coronavirus 2 by RT PCR NEGATIVE NEGATIVE Final    Comment: (NOTE) SARS-CoV-2 target nucleic acids are NOT DETECTED.  The SARS-CoV-2 RNA is generally detectable in upper respiratory specimens during the acute phase of infection. The lowest concentration of SARS-CoV-2 viral copies this assay can detect is 138 copies/mL. A negative result does not preclude SARS-Cov-2 infection and should not be used as the sole basis for treatment or other patient management decisions. A negative result may occur with  improper specimen collection/handling, submission of specimen other than nasopharyngeal swab, presence of viral mutation(s) within the areas targeted by this assay, and inadequate number of viral copies(<138 copies/mL). A negative result must be combined with clinical observations, patient history, and epidemiological information. The expected result is Negative.  Fact Sheet  for Patients:  EntrepreneurPulse.com.au  Fact Sheet for Healthcare Providers:  IncredibleEmployment.be  This test is no t yet approved or cleared by the [place] FDA and  has been authorized for detection and/or diagnosis of SARS-CoV-2 by FDA under an Emergency Use Authorization (EUA). This EUA will remain  in effect (meaning this test can be used) for the duration of the COVID-19 declaration under Section 564(b)(1) of the Act, 21 U.S.C.section 360bbb-3(b)(1), unless the authorization is terminated  or revoked sooner.       Influenza A by PCR NEGATIVE NEGATIVE Final   Influenza B by PCR NEGATIVE NEGATIVE Final    Comment: (NOTE) The Xpert Xpress SARS-CoV-2/FLU/RSV plus assay is intended as an aid in the diagnosis of influenza from Nasopharyngeal swab specimens and should not be used as a sole basis for treatment. Nasal washings and aspirates are unacceptable for Xpert Xpress SARS-CoV-2/FLU/RSV testing.  Fact Sheet for Patients: EntrepreneurPulse.com.au  Fact Sheet for Healthcare Providers: IncredibleEmployment.be  This test is not yet approved or cleared by the [place] FDA and has been authorized for detection and/or diagnosis of SARS-CoV-2 by FDA under an Emergency Use Authorization (EUA). This EUA will remain in effect (meaning this test can be used) for the duration of the COVID-19 declaration under Section 564(b)(1) of the Act, 21 U.S.C. section 360bbb-3(b)(1), unless the authorization is terminated or revoked.  Performed at [facility], [place] [address], [place], [place] [address]      Time coordinating discharge: Over 30 minutes  SIGNED:   Darliss Cheney, MD  [facility] 02/22/2022, 8:58 AM *Please note that this is a verbal dictation therefore any spelling or grammatical errors are due to the "[place]" system interpretation. If 7PM-7AM,  please contact  night-coverage [email or web address]

## 2022-02-22 NOTE — Evaluation (Signed)
Occupational Therapy Evaluation ?Patient Details ?Name: Noah Osborn ?MRN: [id number] ?DOB: [birth year] ?Today's Date: 02/22/2022 ? ? ?History of Present Illness Pt is a 88 y/o male admitted secondary to dizziness. Imaging showed L parietal infarct. PMH includes ESRD on dialysis MWF, anemia of CKD, HLD, HTN, hx of prostate cancer, T2DM  ? ?Clinical Impression ?  ?Patient progressing well towards goals.  Denies dizziness during session, but reports he has had intermittent dizziness since last session.  Pt using RW with min guard for transfers and mobility in room.  Min guard for LB dressing and grooming at sink.  Simulated tub transfer with min guard, reviewed ADLs in sitting for safety due to intermittent dizziness.  He plans to have assist at home during the day once discharged.  Pt with good safety awareness and use of RW.  Will follow acutely.  ?   ? ?Recommendations for follow up therapy are one component of a multi-disciplinary discharge planning process, led by the attending physician.  Recommendations may be updated based on patient status, additional functional criteria and insurance authorization.  ? ?Follow Up Recommendations ? Home health OT  ?  ?Assistance Recommended at Discharge Frequent or constant Supervision/Assistance  ?Patient can return home with the following A little help with walking and/or transfers;A little help with bathing/dressing/bathroom;Direct supervision/assist for medications management;Assistance with cooking/housework;Direct supervision/assist for financial management;Assist for transportation;Help with stairs or ramp for entrance ? ?  ?Functional Status Assessment ?    ?Equipment Recommendations ? BSC/3in1;Tub/shower seat  ?  ?Recommendations for Other Services   ? ? ?  ?Precautions / Restrictions Precautions ?Precautions: Fall ?Restrictions ?Weight Bearing Restrictions: No  ? ?  ? ?Mobility Bed Mobility ?  ?  ?  ?  ?  ?  ?  ?General bed mobility comments: EOB upon entry ?   ? ?Transfers ?Overall transfer level: Needs assistance ?Equipment used: Rolling walker (2 wheels) ?Transfers: Sit to/from Stand ?Sit to Stand: Min guard ?  ?  ?  ?  ?  ?General transfer comment: min gaurd for safety ?  ? ?  ?Balance Overall balance assessment: Needs assistance ?Sitting-balance support: No upper extremity supported, Feet supported ?Sitting balance-Leahy Scale: Fair ?  ?  ?Standing balance support: Bilateral upper extremity supported, During functional activity, No upper extremity supported ?Standing balance-Leahy Scale: Fair ?Standing balance comment: B UE support dynamically using RW, min guard to close supervision without UE support during ADLs ?  ?  ?  ?  ?  ?  ?  ?  ?  ?  ?  ?   ? ?ADL either performed or assessed with clinical judgement  ? ?ADL Overall ADL's : Needs assistance/impaired ?  ?  ?Grooming: Min guard;Standing ?  ?  ?  ?  ?  ?  ?  ?Lower Body Dressing: Min guard;Sit to/from stand ?Lower Body Dressing Details (indicate cue type and reason): able to don/doff socks and shoes, no dizziness today ?Toilet Transfer: Min guard;Ambulation;Rolling walker (2 wheels) ?  ?  ?  ?Tub/ Shower Transfer: Min guard;Tub transfer;Ambulation;Shower seat;Grab bars ?Tub/Shower Transfer Details (indicate cue type and reason): simulated in room ?Functional mobility during ADLs: Min guard;Rolling walker (2 wheels) ?General ADL Comments: dicussed safety with intermittent dizziness, completing ADLs in sitting  ? ? ? ?Vision   ?   ?   ?Perception   ?  ?Praxis   ?  ? ?Pertinent Vitals/Pain Pain Assessment ?Pain Assessment: No/denies pain  ? ? ? ?Hand Dominance   ?  ?Extremity/Trunk  Assessment   ?  ?  ?  ?  ?  ?Communication   ?  ?Cognition Arousal/Alertness: Awake/alert ?Behavior During Therapy: [facility] for tasks assessed/performed ?Overall Cognitive Status: Within Functional Limits for tasks assessed ?  ?  ?  ?  ?  ?  ?  ?  ?  ?  ?  ?  ?  ?  ?  ?  ?  ?  ?  ?General Comments  pt continues to report family will be  around during the day.  Pt denies dizziness during session, but reports it has been intermittent. ? ?  ?Exercises   ?  ?Shoulder Instructions    ? ? ?Home Living   ?  ?  ?  ?  ?  ?  ?  ?  ?  ?  ?  ?  ?  ?  ?  ?  ?  ?  ? ?  ?Prior Functioning/Environment   ?  ?  ?  ?  ?  ?  ?  ?  ?  ? ?  ?  ?OT Problem List:   ?  ?   ?OT Treatment/Interventions:    ?  ?OT Goals(Current goals can be found in the care plan section) Acute Rehab OT Goals ?Patient Stated Goal: home ?OT Goal Formulation: With patient ?Time For Goal Achievement: 03/07/22 ?Potential to Achieve Goals: Good  ?OT Frequency: Min 2X/week ?  ? ?Co-evaluation   ?  ?  ?  ?  ? ?  ?AM-PAC OT "6 Clicks" Daily Activity     ?Outcome Measure Help from another person eating meals?: A Little ?Help from another person taking care of personal grooming?: A Little ?Help from another person toileting, which includes using toliet, bedpan, or urinal?: A Little ?Help from another person bathing (including washing, rinsing, drying)?: A Little ?Help from another person to put on and taking off regular upper body clothing?: A Little ?Help from another person to put on and taking off regular lower body clothing?: A Little ?6 Click Score: 18 ?  ?End of Session Equipment Utilized During Treatment: Rolling walker (2 wheels);Gait belt ?Nurse Communication: Mobility status;Other (comment) (eager to get leads off to get dressed) ? ?Activity Tolerance: Patient tolerated treatment well ?Patient left: with call bell/phone within reach;with bed alarm set;Other (comment) (sitting EOB) ? ?OT Visit Diagnosis: Other abnormalities of gait and mobility (R26.89);Unsteadiness on feet (R26.81);Dizziness and giddiness (R42);Muscle weakness (generalized) (M62.81)  ?              ?Time: 0981-1914 ?OT Time Calculation (min): 17 min ?Charges:  OT General Charges ?$OT Visit: 1 Visit ?OT Treatments ?$Self Care/Home Management : 8-22 mins ? ?Jolaine Artist, OT ?Acute Rehabilitation Services ?Pager  [phone] ?Office [phone] ? ? ?Delight Stare ?02/22/2022, 10:42 AM ?

## 2022-02-22 NOTE — Progress Notes (Signed)
Physical Therapy Treatment ?Patient Details ?Name: Noah Osborn ?MRN: [id number] ?DOB: July 09, 1924 ?Today's Date: 02/22/2022 ? ? ?History of Present Illness Pt is a 88 y/o male admitted secondary to dizziness. Imaging showed L parietal infarct. PMH includes ESRD on dialysis MWF, anemia of CKD, HLD, HTN, hx of prostate cancer, T2DM ? ?  ?PT Comments  ? ? Pt irritated about not having breakfast initially, PT helped get breakfast ordered. PT then returned and pt reporting "The doc told me I can leave at 9am but i'm still here" and was again very irritated. Pt did agree to amb with PT. Pt denies dizziness today but did have an episode of dizziness when bending over to put his shoes on at EOB. Educated pt and daughter in law about R posterior canal BPPV and the benefits of vestibular therapy. Left message with case management to request a home health therapist would practiced vestibular therapy. Pt lives alone, however grandson spends the night with him and daughter in law reports "we check on him regularly". Pt instructed to use RW at this time for ambulation, pt agreed. Pt also instructed to not bath or cook without supervision. Acute PT TO cont to follow. ?   ?Recommendations for follow up therapy are one component of a multi-disciplinary discharge planning process, led by the attending physician.  Recommendations may be updated based on patient status, additional functional criteria and insurance authorization. ? ?Follow Up Recommendations ? Home health PT ?  ?  ?Assistance Recommended at Discharge Frequent or constant Supervision/Assistance (initially)  ?Patient can return home with the following A little help with walking and/or transfers;A little help with bathing/dressing/bathroom;Assistance with cooking/housework;Help with stairs or ramp for entrance;Assist for transportation ?  ?Equipment Recommendations ? Rolling walker (2 wheels);Other (comment) (shower chair)  ?  ?Recommendations for Other Services   ? ? ?   ?Precautions / Restrictions Precautions ?Precautions: Fall ?Restrictions ?Weight Bearing Restrictions: No  ?  ? ?Mobility ? Bed Mobility ?  ?  ?  ?  ?  ?  ?  ?General bed mobility comments: EOB upon entry ?  ? ?Transfers ?Overall transfer level: Needs assistance ?Equipment used: Rolling walker (2 wheels) ?Transfers: Sit to/from Stand ?Sit to Stand: Min guard ?  ?  ?  ?  ?  ?General transfer comment: pt quick to move, pt with poor walker management, verbal cues to reach back for edge of bed and to push up from bed ?  ? ?Ambulation/Gait ?Ambulation/Gait assistance: Min guard ?Gait Distance (Feet): 100 Feet ?Assistive device: 2 person hand held assist, Rolling walker (2 wheels) ?Gait Pattern/deviations: Step-through pattern, Decreased stride length ?Gait velocity: Decreased ?Gait velocity interpretation: <1.31 ft/sec, indicative of household ambulator ?  ?General Gait Details: trunk flexion, verbal cues to stay in walker, educated daughter in law on how to measure height of walker to match up with pt's wrist when he gets home, poor walker awareness when turning to sit ? ? ?Stairs ?  ?  ?  ?  ?  ? ? ?Wheelchair Mobility ?  ? ?Modified Rankin (Stroke Patients Only) ?Modified Rankin (Stroke Patients Only) ?Pre-Morbid Rankin Score: Slight disability ?Modified Rankin: Moderately severe disability ? ? ?  ?Balance Overall balance assessment: Needs assistance ?Sitting-balance support: No upper extremity supported, Feet supported ?Sitting balance-Leahy Scale: Fair ?Sitting balance - Comments: pt able to don shoes at EOB however did reports episode of dizziness when bending over ?  ?Standing balance support: Bilateral upper extremity supported, During functional activity, No upper extremity  supported ?Standing balance-Leahy Scale: Fair ?Standing balance comment: B UE support dynamically using RW, ?  ?  ?  ?  ?  ?  ?  ?  ?  ?  ?  ?  ? ?  ?Cognition Arousal/Alertness: Awake/alert ?Behavior During Therapy: Impulsive (irritated,  pulled out IV because he wants to go home, MD said he could go home at [place] and wants to know why he's still here) ?Overall Cognitive Status: No family/caregiver present to determine baseline cognitive functioning ?  ?  ?  ?  ?  ?  ?  ?  ?  ?  ?  ?  ?  ?  ?  ?  ?General Comments: pt impulsive with decreased insight to deficits and safety however suspect this to be baseline, per dtr in law "He's always been a do it yourselfer kind of guy" ?  ?  ? ?  ?Exercises   ? ?  ?General Comments General comments (skin integrity, edema, etc.): Per PT eval pt with + R dix hallpike and noted dizziness associated with head turns, pt however very focused on breakfast and then leaving. Pt denied dizziness at this time ?  ?  ? ?Pertinent Vitals/Pain    ? ? ?Home Living   ?  ?  ?  ?  ?  ?  ?  ?  ?  ?   ?  ?Prior Function    ?  ?  ?   ? ?PT Goals (current goals can now be found in the care plan section) Acute Rehab PT Goals ?Patient Stated Goal: to go home ?PT Goal Formulation: With patient ?Time For Goal Achievement: 03/07/22 ?Potential to Achieve Goals: Good ?Progress towards PT goals: Progressing toward goals ? ?  ?Frequency ? ? ? Min 3X/week ? ? ? ?  ?PT Plan Current plan remains appropriate  ? ? ?Co-evaluation   ?  ?  ?  ?  ? ?  ?AM-PAC PT "6 Clicks" Mobility   ?Outcome Measure ? Help needed turning from your back to your side while in a flat bed without using bedrails?: A Little ?Help needed moving from lying on your back to sitting on the side of a flat bed without using bedrails?: A Little ?Help needed moving to and from a bed to a chair (including a wheelchair)?: A Little ?Help needed standing up from a chair using your arms (e.g., wheelchair or bedside chair)?: A Little ?Help needed to walk in hospital room?: A Little ?Help needed climbing 3-5 steps with a railing? : A Lot ?6 Click Score: 17 ? ?  ?End of Session Equipment Utilized During Treatment: Gait belt ?Activity Tolerance: Patient tolerated treatment well ?Patient left:  in bed;with call bell/phone within reach;with family/visitor present (sitting EOB) ?Nurse Communication: Mobility status ?PT Visit Diagnosis: Unsteadiness on feet (R26.81);Muscle weakness (generalized) (M62.81);Difficulty in walking, not elsewhere classified (R26.2);Dizziness and giddiness (R42) ?  ? ? ?Time: 0165-5374 ?PT Time Calculation (min) (ACUTE ONLY): 22 min ? ?Charges:  $Gait Training: 8-22 mins          ?          ? ?Kittie Plater, PT, DPT ?Acute Rehabilitation Services ?Pager #: [phone] ?Office #: [phone] ? ? ? ?Allecia Bells M Graycee Greeson ?02/22/2022, 10:51 AM ? ?

## 2022-02-22 NOTE — TOC Transition Note (Signed)
Transition of Care (TOC) - CM/SW Discharge Note ? ? ?Patient Details  ?Name: Noah Osborn ?MRN: [id number] ?Date of Birth: 1924/10/14 ? ?Transition of Care (TOC) CM/SW Contact:  ?Pollie Friar, RN ?Phone Number: ?02/22/2022, 11:00 AM ? ? ?Clinical Narrative:    ?Patient is discharging home with home health services through [facility]. Information on the AVS.  ?Patient states he has walker and 3 in 1 at home.  ?Pt is responsible for his own home meds.  ?Pt provides his own transportation.  ?DIL will provide transport home today. ? ? ?Final next level of care: [place] ?Barriers to Discharge: No Barriers Identified ? ? ?Patient Goals and CMS Choice ?  ?CMS Medicare.gov Compare Post Acute Care list provided to:: Patient ?Choice offered to / list presented to : Patient ? ?Discharge Placement ?  ?           ?  ?  ?  ?  ? ?Discharge Plan and Services ?  ?  ?           ?  ?  ?  ?  ?  ?HH Arranged: PT, OT ?[place] Agency: [facility] ?Date HH Agency Contacted: 02/22/22 ?  ?Representative spoke with at [facility]: Tommi Rumps ? ?Social Determinants of Health (SDOH) Interventions ?  ? ? ?Readmission Risk Interventions ?No flowsheet data found. ? ? ? ? ?

## 2022-02-22 NOTE — Progress Notes (Signed)
Contacted by renal NP with request that pt receive out-pt HD treatment at clinic tomorrow due to pt being off regular schedule. Pt would then resume normal schedule on Friday. Contacted [facility] and spoke to [employer], Therapist, sports. Clinic agreeable to provide treatment tomorrow and will call pt/family later today with appt time. This info provided to renal NP who is agreeable to plan. Contacted pt's son, Juanda Crumble, via phone. Son advised that clinic will be calling today with pt's appt time for tomorrow. Son agreeable to plan.  ? ?Melven Sartorius ?Renal Navigator ?[phone] ?

## 2022-02-22 NOTE — Progress Notes (Signed)
Pt has no questions after education and Packet given to pt. IV taken out. Clothes on pt. No other belongings.  ?

## 2022-02-22 NOTE — Progress Notes (Addendum)
[facility] Progress Note   Subjective:    Seen and examined patient at bedside. No complaints. Tolerated yesterday's HD with net UF 2L. Patient reports going home today.   Objective Vitals:   02/22/22 0004 02/22/22 0318 02/22/22 0715 02/22/22 1108  BP: (!) 147/70 (!) 149/82 125/64 128/67  Pulse: 78 66 73 66  Resp: '18 16 16 18  '$ Temp: 98.3 F (36.8 C) (!) 97.4 F (36.3 C) 98.7 F (37.1 C) 98 F (36.7 C)  TempSrc: Oral Oral Oral Oral  SpO2: 93% 93% 96% 97%  Weight:      Height:       Physical Exam General: Elderly male; sitting at side of bed; NAD  Lungs: Clear throughout. No wheeze, rales or rhonchi. Breathing is unlabored. Heart: RRR. No murmur, rubs or gallops.  Abdomen: soft, nontender, +BS Lower extremities: no edema BLLE Neuro: AAOx3. Moves all extremities spontaneously. Dialysis Access: L AVF (+) B/T  Filed Weights   02/20/22 1038  Weight: 87 kg    Intake/Output Summary (Last 24 hours) at 02/22/2022 1144 Last data filed at 02/21/2022 1950 Gross per 24 hour  Intake --  Output 2000 ml  Net -2000 ml    Additional Objective Labs: Basic Metabolic Panel: Recent Labs  Lab 02/20/22 1048 02/21/22 1742  NA 141   141 137  K 4.3   4.2 4.5  CL 101   102 101  CO2 '26   26 23  '$ GLUCOSE 120*   131* 100*  BUN 58*   57* 72*  CREATININE 9.65*   9.67* 10.73*  CALCIUM 8.9   8.9 8.7*  PHOS 6.1* 8.1*   Liver Function Tests: Recent Labs  Lab 02/20/22 1048 02/21/22 1742  ALBUMIN 3.1* 2.7*   No results for input(s): LIPASE, AMYLASE in the last 168 hours. CBC: Recent Labs  Lab 02/20/22 1048 02/21/22 1742 02/22/22 0100  WBC 7.5 6.3 5.7  NEUTROABS  --   --  3.6  HGB 10.5* 9.8* 10.3*  HCT 33.0* 31.5* 32.1*  MCV 108.6* 107.5* 104.9*  PLT 257 250 239   Blood Culture No results found for: SDES, SPECREQUEST, CULT, REPTSTATUS  Cardiac Enzymes: No results for input(s): CKTOTAL, CKMB, CKMBINDEX, TROPONINI in the last 168 hours. CBG: Recent Labs  Lab  02/21/22 2024  GLUCAP 108*   Iron Studies: No results for input(s): IRON, TIBC, TRANSFERRIN, FERRITIN in the last 72 hours. No results found for: INR, PROTIME Studies/Results: CT ANGIO HEAD NECK W WO CM  Result Date: 02/20/2022 CLINICAL DATA:  Stroke/TIA, determine embolic source. Additional history provided: Dizziness. EXAM: CT ANGIOGRAPHY HEAD AND NECK TECHNIQUE: Multidetector CT imaging of the head and neck was performed using the standard protocol during bolus administration of intravenous contrast. Multiplanar CT image reconstructions and MIPs were obtained to evaluate the vascular anatomy. Carotid stenosis measurements (when applicable) are obtained utilizing NASCET criteria, using the distal internal carotid diameter as the denominator. RADIATION DOSE REDUCTION: This exam was performed according to the departmental dose-optimization program which includes automated exposure control, adjustment of the mA and/or kV according to patient size and/or use of iterative reconstruction technique. CONTRAST:  58m OMNIPAQUE IOHEXOL 350 MG/ML SOLN COMPARISON:  Brain MRI 02/20/2022.  Head CT 02/20/2022. FINDINGS: CTA NECK FINDINGS Aortic arch: Standard aortic branching. Atherosclerotic plaque within the visualized aortic arch and proximal major branch vessels of the neck. No hemodynamically significant innominate or proximal subclavian artery stenosis. Right carotid system: CCA and ICA patent within the neck. Prominent soft and calcified plaque  about the carotid bifurcation and within the proximal ICA as well as proximal ECA. Resultant stenosis at the origin of the right ICA of greater than 70%. Severe, near occlusive stenosis at the origin of the ECA. Left carotid system: CCA and ICA patent within the neck. Advanced soft and calcified plaque about the carotid bifurcation and within the proximal ICA. Resultant severe, near occlusive stenosis at the origin of the left ICA. Mild calcified plaque is also present at  the CCA origin. Partially retropharyngeal course of the ICA. Vertebral arteries: Vertebral arteries codominant and patent within the neck. Nonstenotic calcified plaque at the origin of both vessels. Skeleton: Cervicothoracic levocurvature. Partially imaged thoracic dextrocurvature more inferiorly. Advanced cervical spondylosis. C7-T1 grade 1 anterolisthesis. No acute bony abnormality or aggressive osseous lesion. Other neck: No neck mass or cervical lymphadenopathy. Upper chest: No consolidation within the imaged lung apices. Review of the MIP images confirms the above findings CTA HEAD FINDINGS Anterior circulation: The intracranial internal carotid arteries are patent. Calcified plaque within both vessels. No significant stenosis of the intracranial right ICA. Mild-to-moderate narrowing of the paraclinoid left ICA. The M1 middle cerebral arteries are patent. No M2 proximal branch occlusion or high-grade proximal stenosis is identified. The anterior cerebral arteries are patent. Posterior circulation: The intracranial vertebral arteries are patent. Atherosclerotic plaque within both vessels with no more than mild stenosis. The basilar artery is patent. The posterior cerebral arteries are patent. Posterior communicating arteries are diminutive or absent bilaterally. Venous sinuses: Within the limitations of contrast timing, no convincing thrombus. Anatomic variants: As described. Review of the MIP images confirms the above findings IMPRESSION: CTA neck: 1. The common carotid and internal carotid arteries are patent within the neck. However, there is prominent soft and calcified plaque about the carotid bifurcations and within the proximal ICAs, bilaterally. Resultant severe, near-occlusive stenosis at the origin of the left ICA (greater than 90%). There is also severe stenosis at the origin of the right ICA (greater than 70%). Additionally, there is severe, near-occlusive stenosis at the origin of the right external  carotid artery. 2. Vertebral arteries codominant and patent within the neck without significant stenosis. 3.  Aortic Atherosclerosis (ICD10-I70.0). 4. Advanced cervical spondylosis. 5. Cervicothoracic spinal curvature as described. 6. C7-T1 grade 1 anterolisthesis. CTA head: 1. No intracranial large vessel occlusion or proximal high-grade arterial stenosis. 2. Atherosclerotic plaque within the intracranial internal carotid arteries and vertebral arteries, with no more than mild stenosis at these sites. Electronically Signed   By: Kellie Simmering D.O.   On: 02/20/2022 16:01   MR BRAIN WO CONTRAST  Result Date: 02/20/2022 EXAM: MRI HEAD WITHOUT CONTRAST TECHNIQUE: Multiplanar, multiecho pulse sequences of the brain and surrounding structures were obtained without intravenous contrast. COMPARISON:  Same day CT head. FINDINGS: Brain: Punctate focus of apparent DWI hyperintensity in the high left parietal white matter (series 3, image 32) without definite ADC correlate. Mild-to-moderate scattered T2/FLAIR hyperintensities within the white matter, nonspecific but compatible with chronic microvascular ischemic disease. Cerebral atrophy with ex vacuo ventricular dilation. No evidence of acute hemorrhage, mass lesion, midline shift, or extra-axial fluid collection. Vascular: Abnormal flow void within the proximal left intradural vertebral artery. Otherwise, major arterial flow voids are maintained. Skull and upper cervical spine: Normal marrow signal. Sinuses/Orbits: Clear visualized sinuses.  Unremarkable orbits. Other: No mastoid effusions IMPRESSION: 1. Punctate focus of apparent DWI hyperintensity in the high left parietal white matter may represent a tiny acute/subacute infarct or artifact. 2. Abnormal flow void within the proximal left  intradural vertebral artery, potentially representing significant stenosis or artifact. A CTA could further evaluate if clinically indicated. 3. Chronic microvascular disease and  cerebral atrophy (ICD10-G31.9). Electronically Signed   By: Margaretha Sheffield M.D.   On: 02/20/2022 13:53   ECHOCARDIOGRAM COMPLETE  Result Date: 02/21/2022    ECHOCARDIOGRAM REPORT   Patient Name:   Noah Osborn Date of Exam: 02/21/2022 Medical Rec #:  [id number]       Height:       69.0 in Accession #:    [id number]      Weight:       191.8 lb Date of Birth:  [birth year]      BSA:          2.030 m Patient Age:    88 years        BP:           133/60 mmHg Patient Gender: M               HR:           77 bpm. Exam Location:  Inpatient Procedure: 2D Echo, Cardiac Doppler and Color Doppler Indications:    Stroke I63.9  History:        Patient has no prior history of Echocardiogram examinations.  Sonographer:    Bernadene Person RDCS Referring Phys: [id number] ALLISON WOLFE IMPRESSIONS  1. LV apical false tendon (normal variant). Left ventricular ejection fraction, by estimation, is 60 to 65%. Left ventricular ejection fraction by PLAX is 64 %. The left ventricle has normal function. The left ventricle has no regional wall motion abnormalities. There is mild left ventricular hypertrophy. Left ventricular diastolic parameters are consistent with Grade I diastolic dysfunction (impaired relaxation).  2. Right ventricular systolic function is normal. The right ventricular size is normal. There is normal pulmonary artery systolic pressure. The estimated right ventricular systolic pressure is 58.8 mmHg.  3. Left atrial size was moderately dilated.  4. Right atrial size was mildly dilated.  5. The mitral valve is abnormal. Mild to moderate mitral valve regurgitation. Moderate mitral annular calcification.  6. AI is eccentric and posterior directed at the anterior leaflet of the mitral valve. The aortic valve is tricuspid. There is moderate calcification of the aortic valve. There is moderate thickening of the aortic valve. Aortic valve regurgitation is mild. Moderate to severe aortic valve stenosis. Aortic valve area, by VTI  measures 1.04 cm. Aortic valve mean gradient measures 26.3 mmHg. Aortic valve Vmax measures 3.26 m/s.  7. Aortic dilatation noted. There is borderline dilatation of the ascending aorta, measuring 38 mm.  8. The inferior vena cava is normal in size with <50% respiratory variability, suggesting right atrial pressure of 8 mmHg.  9. Evidence of atrial level shunting detected by color flow Doppler. There is a moderately sized patent foramen ovale with predominantly left to right shunting across the atrial septum. Comparison(s): No prior Echocardiogram. Conclusion(s)/Recommendation(s): Consider further evaluation with TEE/bubble study to evaluate the ASD/PFO. FINDINGS  Left Ventricle: LV apical false tendon (normal variant). Left ventricular ejection fraction, by estimation, is 60 to 65%. Left ventricular ejection fraction by PLAX is 64 %. The left ventricle has normal function. The left ventricle has no regional wall  motion abnormalities. The left ventricular internal cavity size was normal in size. There is mild left ventricular hypertrophy. Left ventricular diastolic parameters are consistent with Grade I diastolic dysfunction (impaired relaxation). Indeterminate filling pressures. Right Ventricle: The right ventricular size is normal. No increase in right ventricular  wall thickness. Right ventricular systolic function is normal. There is normal pulmonary artery systolic pressure. The tricuspid regurgitant velocity is 2.58 m/s, and  with an assumed right atrial pressure of 8 mmHg, the estimated right ventricular systolic pressure is 98.3 mmHg. Left Atrium: Left atrial size was moderately dilated. Right Atrium: Right atrial size was mildly dilated. Pericardium: There is no evidence of pericardial effusion. Mitral Valve: The mitral valve is abnormal. There is mild thickening of the posterior and anterior mitral valve leaflet(s). There is moderate calcification of the posterior mitral valve leaflet(s). Moderate mitral  annular calcification. Mild to moderate mitral valve regurgitation. Tricuspid Valve: The tricuspid valve is grossly normal. Tricuspid valve regurgitation is trivial. Aortic Valve: AI is eccentric and posterior directed at the anterior leaflet of the mitral valve. The aortic valve is tricuspid. There is moderate calcification of the aortic valve. There is moderate thickening of the aortic valve. Aortic valve regurgitation is mild. Moderate to severe aortic stenosis is present. Aortic valve mean gradient measures 26.3 mmHg. Aortic valve peak gradient measures 42.4 mmHg. Aortic valve area, by VTI measures 1.04 cm. Pulmonic Valve: The pulmonic valve was normal in structure. Pulmonic valve regurgitation is not visualized. Aorta: Aortic dilatation noted. There is borderline dilatation of the ascending aorta, measuring 38 mm. Venous: The inferior vena cava is normal in size with less than 50% respiratory variability, suggesting right atrial pressure of 8 mmHg. IAS/Shunts: Evidence of atrial level shunting detected by color flow Doppler. A moderately sized patent foramen ovale is detected with predominantly left to right shunting across the atrial septum.  LEFT VENTRICLE PLAX 2D LV EF:         Left ventricular ejection fraction by PLAX is 64 %. LVIDd:         4.90 cm LVIDs:         3.20 cm LV PW:         1.40 cm LV IVS:        1.20 cm LVOT diam:     2.20 cm LV SV:         83 LV SV Index:   41 LVOT Area:     3.80 cm  LV Volumes (MOD) LV vol d, MOD A2C: 132.0 ml LV vol d, MOD A4C: 125.0 ml LV vol s, MOD A2C: 76.2 ml LV vol s, MOD A4C: 72.1 ml LV SV MOD A2C:     55.8 ml LV SV MOD A4C:     125.0 ml LV SV MOD BP:      52.6 ml RIGHT VENTRICLE RV S prime:     12.90 cm/s TAPSE (M-mode): 2.2 cm LEFT ATRIUM             Index        RIGHT ATRIUM           Index LA diam:        4.60 cm 2.27 cm/m   RA Area:     16.70 cm LA Vol (A2C):   79.0 ml 38.92 ml/m  RA Volume:   43.50 ml  21.43 ml/m LA Vol (A4C):   82.9 ml 40.85 ml/m LA  Biplane Vol: 84.1 ml 41.44 ml/m  AORTIC VALVE AV Area (Vmax):    1.21 cm AV Area (Vmean):   1.08 cm AV Area (VTI):     1.04 cm AV Vmax:           325.67 cm/s AV Vmean:          246.667 cm/s AV  VTI:            0.797 m AV Peak Grad:      42.4 mmHg AV Mean Grad:      26.3 mmHg LVOT Vmax:         103.50 cm/s LVOT Vmean:        70.233 cm/s LVOT VTI:          0.218 m LVOT/AV VTI ratio: 0.27  AORTA Ao Root diam: 3.80 cm Ao Asc diam:  3.80 cm MITRAL VALVE                TRICUSPID VALVE MV Area (PHT): 3.42 cm     TR Peak grad:   26.6 mmHg MV Decel Time: 222 msec     TR Vmax:        258.00 cm/s MR PISA:        0.57 cm MR PISA Radius: 0.30 cm     SHUNTS MV E velocity: 141.00 cm/s  Systemic VTI:  0.22 m MV A velocity: 150.00 cm/s  Systemic Diam: 2.20 cm MV E/A ratio:  0.94 Lyman Bishop MD Electronically signed by Lyman Bishop MD Signature Date/Time: 02/21/2022/5:22:41 PM    Final    VAS US CAROTID  Result Date: 02/20/2022 Carotid Arterial Duplex Study Patient Name:  Noah Osborn  Date of Exam:   02/20/2022 Medical Rec #: [id number]        Accession #:    [id number] Date of Birth: [birth year]       Patient Gender: M Patient Age:   43 years Exam Location:  [facility] Procedure:      VAS US CAROTID Referring Phys: Orma Flaming --------------------------------------------------------------------------------  Indications:       Carotid artery disease and Dizziness. Limitations        Today's exam was limited due to the patient's respiratory                    variation. Comparison Study:  No prior study Performing Technologist: Maudry Mayhew MHA, RDMS, RVT, RDCS  Examination Guidelines: A complete evaluation includes B-mode imaging, spectral Doppler, color Doppler, and power Doppler as needed of all accessible portions of each vessel. Bilateral testing is considered an integral part of a complete examination. Limited examinations for reoccurring indications may be performed as noted.  Right Carotid Findings:  +----------+--------+--------+--------+------------------+---------+             PSV cm/s EDV cm/s Stenosis Plaque Description Comments   +----------+--------+--------+--------+------------------+---------+  CCA Prox   76       11                                              +----------+--------+--------+--------+------------------+---------+  CCA Distal 71       12                calcific           Shadowing  +----------+--------+--------+--------+------------------+---------+  ICA Prox   257      60       40-59%   calcific           Shadowing  +----------+--------+--------+--------+------------------+---------+  ICA Distal 97       13                                              +----------+--------+--------+--------+------------------+---------+  ECA        190                        calcific           shadowing  +----------+--------+--------+--------+------------------+---------+ +----------+--------+-------+----------------+-------------------+             PSV cm/s EDV cms Describe         Arm Pressure (mmHG)  +----------+--------+-------+----------------+-------------------+  Subclavian 210              Multiphasic, WNL                      +----------+--------+-------+----------------+-------------------+ +---------+--------+--+--------+--+---------+  Vertebral PSV cm/s 70 EDV cm/s 15 Antegrade  +---------+--------+--+--------+--+---------+  Left Carotid Findings: +----------+--------+--------+--------+-------------------------+---------+             PSV cm/s EDV cm/s Stenosis Plaque Description        Comments   +----------+--------+--------+--------+-------------------------+---------+  CCA Prox   78       7                                                      +----------+--------+--------+--------+-------------------------+---------+  CCA Distal 125      13                                                     +----------+--------+--------+--------+-------------------------+---------+  ICA Prox   176      31        40-59%   calcific and heterogenous Shadowing  +----------+--------+--------+--------+-------------------------+---------+  ICA Distal 109      22                                                     +----------+--------+--------+--------+-------------------------+---------+  ECA        96                         calcific                  shadowing  +----------+--------+--------+--------+-------------------------+---------+ +----------+--------+--------+---------------------+-------------------+             PSV cm/s EDV cm/s Describe              Arm Pressure (mmHG)  +----------+--------+--------+---------------------+-------------------+  Subclavian 281               Monophasic, turbulent                      +----------+--------+--------+---------------------+-------------------+ +---------+--------+--+--------+--+---------+  Vertebral PSV cm/s 33 EDV cm/s 10 Antegrade  +---------+--------+--+--------+--+---------+   Summary: Right Carotid: Velocities in the right ICA are consistent with an upper range                40-59% stenosis. Left Carotid: Velocities in the left ICA are consistent with a 40-59% stenosis               by peak systolic velocity, upper range 1-39% stenosis by end  diastolic velocity. Vertebrals:  Bilateral vertebral arteries demonstrate antegrade flow. Subclavians: Left subclavian artery flow was disturbed. Normal flow hemodynamics              were seen in the right subclavian artery. *See table(s) above for measurements and observations.  Electronically signed by Servando Snare MD on 02/20/2022 at 7:06:59 PM.    Final     Medications:   aspirin EC  81 mg Oral Daily   calcium acetate  1,334 mg Oral BID WC   Chlorhexidine Gluconate Cloth  6 each Topical Q0600   clopidogrel  75 mg Oral Daily   heparin  5,000 Units Subcutaneous Q8H   levothyroxine  88 mcg Oral QAC breakfast   lidocaine  1 patch Transdermal Q24H   rosuvastatin  20 mg Oral Daily    Dialysis Orders: MWF  - [facility] 3hrs, BFR 350, DFR 600,  EDW 87.5kg, 2K/ 2Ca Heparin 3000 unit bolus Mircera 50 mcg q4wks - last 02/15/22 Calcitriol 1.18mg PO qHD-last 02/17/22  Assessment/Plan: CVA-Possible embolic etiology. Neuro following. CVA work-up in progress: CT Head, MR Brain, and CT Angio Head. Reviewed recent notes: CTA shows no large vessel occlusion or proximal arterial stenosis but showed ICA stenosis-appears treating with medical therapy vs need for surgical intervention. ESRD - on HD MWF. Tolerated yesterday's HD with net UF 2L. Plan to resume HD in outpatient. Hypertension/volume  - Euvolemic on exam; Bps stable Anemia of CKD - Hgb 10.3. ESA not due yet. Secondary Hyperparathyroidism - Ca okay for now. PO4 high. Continue binders. Nutrition - Renal diet with fluid restriction Dispo-Okay for discharge from renal standpoint. I spoke with the Renal Navigator: plan for patient to receive HD tomorrow and Friday this week in outpatient to place patient back on routine schedule.  CTobie Poet NP [facility] 02/22/2022,11:44 AM  LOS: 1 day

## 2022-02-23 NOTE — TOC Transition Note (Signed)
Transition of care contact from inpatient facility ? ?Date of discharge: 02/22/22 ?Date of contact: 02/23/22 ?Method: Phone ?Spoke to: Patient's Son ? ?Patient contacted to discuss transition of care from recent inpatient hospitalization. Patient was admitted to [facility] from 02/20/22-02/22/22 with discharge diagnosis of dizziness and leg weakness. CTA showed left ICA stenosis. ? ?Medication changes were reviewed. DAPT: ASA and Plavix was added to patient's current medication regimen. ? ?Patient will follow up with his outpatient HD unit today and tomorrow (then back on routine schedule) at [facility]. ? ?Tobie Poet, NP ?  ?

## 2022-02-24 ENCOUNTER — Other Ambulatory Visit: Payer: Self-pay

## 2022-02-24 DIAGNOSIS — I6522 Occlusion and stenosis of left carotid artery: Secondary | ICD-10-CM

## 2022-02-27 NOTE — Pre-Procedure Instructions (Signed)
Surgical Instructions ? ? ? Your procedure is scheduled on Tuesday, March 21st. ? Report to [facility] Main Entrance "A" at 08:00 A.M., then check in with the Admitting office. ? Call this number if you have problems the morning of surgery: ? [phone] ? ? If you have any questions prior to your surgery date call [phone]: Open Monday-Friday 8am-4pm ? ? ? Remember: ? Do not eat or drink after midnight the night before your surgery ? ?  ?Take these medicines the morning of surgery with A SIP OF WATER  ?aspirin  ?gabapentin (NEURONTIN)  ?levothyroxine (SYNTHROID) ?sevelamer carbonate (RENVELA) ? ? ?Follow surgeon's instructions on when to stop clopidogrel (PLAVIX) ? ?As of today, STOP taking any Aspirin (unless otherwise instructed by your surgeon) Aleve, Naproxen, Ibuprofen, Motrin, Advil, Goody's, BC's, all herbal medications, fish oil, and all vitamins. ?         ?           ?Do NOT Smoke (Tobacco/Vaping) for 24 hours prior to your procedure. ? ?If you use a CPAP at night, you may bring your mask/headgear for your overnight stay. ?  ?Contacts, glasses, piercing's, hearing aid's, dentures or partials may not be worn into surgery, please bring cases for these belongings.  ?  ?For patients admitted to the hospital, discharge time will be determined by your treatment team. ?  ?Patients discharged the day of surgery will not be allowed to drive home, and someone needs to stay with them for 24 hours. ? ?NO VISITORS WILL BE ALLOWED IN PRE-OP WHERE PATIENTS ARE PREPPED FOR SURGERY.  ONLY 1 SUPPORT PERSON MAY BE PRESENT IN THE WAITING ROOM WHILE YOU ARE IN SURGERY.  IF YOU ARE TO BE ADMITTED, ONCE YOU ARE IN YOUR ROOM YOU WILL BE ALLOWED TWO (2) VISITORS. (1) VISITOR MAY STAY OVERNIGHT BUT MUST ARRIVE TO THE ROOM BY 8pm.  Minor children may have two parents present. Special consideration for safety and communication needs will be reviewed on a case by case basis. ? ? ?Special instructions:   ?[place]- Preparing  For Surgery ? ?Before surgery, you can play an important role. Because skin is not sterile, your skin needs to be as free of germs as possible. You can reduce the number of germs on your skin by washing with CHG (chlorahexidine gluconate) Soap before surgery.  CHG is an antiseptic cleaner which kills germs and bonds with the skin to continue killing germs even after washing.   ? ?Oral Hygiene is also important to reduce your risk of infection.  Remember - BRUSH YOUR TEETH THE MORNING OF SURGERY WITH YOUR REGULAR TOOTHPASTE ? ?Please do not use if you have an allergy to CHG or antibacterial soaps. If your skin becomes reddened/irritated stop using the CHG.  ?Do not shave (including legs and underarms) for at least 48 hours prior to first CHG shower. It is OK to shave your face. ? ?Please follow these instructions carefully. ?  ?Shower the NIGHT BEFORE SURGERY and the MORNING OF SURGERY ? ?If you chose to wash your hair, wash your hair first as usual with your normal shampoo. ? ?After you shampoo, rinse your hair and body thoroughly to remove the shampoo. ? ?Use CHG Soap as you would any other liquid soap. You can apply CHG directly to the skin and wash gently with a scrungie or a clean washcloth.  ? ?Apply the CHG Soap to your body ONLY FROM THE NECK DOWN.  Do not use on open wounds  or open sores. Avoid contact with your eyes, ears, mouth and genitals (private parts). Wash Face and genitals (private parts)  with your normal soap.  ? ?Wash thoroughly, paying special attention to the area where your surgery will be performed. ? ?Thoroughly rinse your body with warm water from the neck down. ? ?DO NOT shower/wash with your normal soap after using and rinsing off the CHG Soap. ? ?Pat yourself dry with a CLEAN TOWEL. ? ?Wear CLEAN PAJAMAS to bed the night before surgery ? ?Place CLEAN SHEETS on your bed the night before your surgery ? ?DO NOT SLEEP WITH PETS. ? ? ?Day of Surgery: ?Take a shower with CHG soap. ?Do not wear  jewelry  ?Do not wear lotions, powders, colognes, or deodorant. ?Do not shave 48 hours prior to surgery.  Men may shave face and neck. ?Do not bring valuables to the hospital. [facility] is not responsible for any belongings or valuables. ? ?Wear Clean/Comfortable clothing the morning of surgery ?Remember to brush your teeth WITH YOUR REGULAR TOOTHPASTE. ?  ?Please read over the following fact sheets that you were given. ? ? ?3 days prior to your procedure or After your COVID test ? ?? You are not required to quarantine however you are required to wear a well-fitting mask when you are out and around people not in your household. If your mask becomes wet or soiled, replace with a new one. ? ?? Wash your hands often with soap and water for 20 seconds or clean your hands with an alcohol-based hand sanitizer that contains at least 60% alcohol. ? ?? Do not share personal items. ? ?? Notify your provider: ? ?o if you are in close contact with someone who has COVID ? ?o or if you develop a fever of 100.4 or greater, sneezing, cough, sore throat, shortness of breath or body aches.   ?

## 2022-02-28 ENCOUNTER — Other Ambulatory Visit: Payer: Self-pay

## 2022-02-28 ENCOUNTER — Encounter (HOSPITAL_COMMUNITY)
Admission: RE | Admit: 2022-02-28 | Discharge: 2022-02-28 | Disposition: A | Discharge status: Home / Self Care | Payer: Medicare Other | Source: Ambulatory Visit | Attending: Vascular Surgery | Admitting: Vascular Surgery

## 2022-02-28 ENCOUNTER — Encounter (HOSPITAL_COMMUNITY): Payer: Self-pay

## 2022-02-28 VITALS — BP 152/83 | HR 73 | Temp 97.9°F | Resp 17 | Ht 69.0 in | Wt 192.6 lb

## 2022-02-28 DIAGNOSIS — Z992 Dependence on renal dialysis: Secondary | ICD-10-CM | POA: Insufficient documentation

## 2022-02-28 DIAGNOSIS — Z01818 Encounter for other preprocedural examination: Secondary | ICD-10-CM

## 2022-02-28 DIAGNOSIS — I120 Hypertensive chronic kidney disease with stage 5 chronic kidney disease or end stage renal disease: Secondary | ICD-10-CM | POA: Diagnosis not present

## 2022-02-28 DIAGNOSIS — Z01812 Encounter for preprocedural laboratory examination: Secondary | ICD-10-CM | POA: Diagnosis not present

## 2022-02-28 DIAGNOSIS — I350 Nonrheumatic aortic (valve) stenosis: Secondary | ICD-10-CM | POA: Insufficient documentation

## 2022-02-28 DIAGNOSIS — N186 End stage renal disease: Secondary | ICD-10-CM | POA: Insufficient documentation

## 2022-02-28 DIAGNOSIS — I6522 Occlusion and stenosis of left carotid artery: Secondary | ICD-10-CM | POA: Insufficient documentation

## 2022-02-28 HISTORY — DX: Dependence on renal dialysis: Z99.2

## 2022-02-28 HISTORY — DX: End stage renal disease: N18.6

## 2022-02-28 HISTORY — DX: Gastro-esophageal reflux disease without esophagitis: K21.9

## 2022-02-28 HISTORY — DX: Unspecified osteoarthritis, unspecified site: M19.90

## 2022-02-28 HISTORY — DX: Nonrheumatic aortic (valve) stenosis: I35.0

## 2022-02-28 HISTORY — DX: Essential (primary) hypertension: I10

## 2022-02-28 HISTORY — DX: Hypothyroidism, unspecified: E03.9

## 2022-02-28 LAB — COMPREHENSIVE METABOLIC PANEL
ALT: 11 U/L (ref 0–44)
AST: 20 U/L (ref 15–41)
Albumin: 3.2 g/dL — ABNORMAL LOW (ref 3.5–5.0)
Alkaline Phosphatase: 57 U/L (ref 38–126)
Anion gap: 12 (ref 5–15)
BUN: 33 mg/dL — ABNORMAL HIGH (ref 8–23)
CO2: 28 mmol/L (ref 22–32)
Calcium: 9.3 mg/dL (ref 8.9–10.3)
Chloride: 98 mmol/L (ref 98–111)
Creatinine, Ser: 6.84 mg/dL — ABNORMAL HIGH (ref 0.61–1.24)
GFR, Estimated: 7 mL/min — ABNORMAL LOW (ref >60)
Glucose, Bld: 92 mg/dL (ref 70–99)
Potassium: 4 mmol/L (ref 3.5–5.1)
Sodium: 138 mmol/L (ref 135–145)
Total Bilirubin: 0.5 mg/dL (ref 0.3–1.2)
Total Protein: 7.2 g/dL (ref 6.5–8.1)

## 2022-02-28 LAB — CBC
HCT: 35.3 % — ABNORMAL LOW (ref 39.0–52.0)
Hemoglobin: 11.3 g/dL — ABNORMAL LOW (ref 13.0–17.0)
MCH: 34.1 pg — ABNORMAL HIGH (ref 26.0–34.0)
MCHC: 32 g/dL (ref 30.0–36.0)
MCV: 106.6 fL — ABNORMAL HIGH (ref 80.0–100.0)
Platelets: 175 10*3/uL (ref 150–400)
RBC: 3.31 MIL/uL — ABNORMAL LOW (ref 4.22–5.81)
RDW: 14.6 % (ref 11.5–15.5)
WBC: 6.4 10*3/uL (ref 4.0–10.5)
nRBC: 0 % (ref 0.0–0.2)

## 2022-02-28 LAB — SURGICAL PCR SCREEN
MRSA, PCR: NEGATIVE
Staphylococcus aureus: NEGATIVE

## 2022-02-28 LAB — APTT: aPTT: 33 seconds (ref 24–36)

## 2022-02-28 LAB — PROTIME-INR
INR: 1.1 (ref 0.8–1.2)
Prothrombin Time: 14.2 seconds (ref 11.4–15.2)

## 2022-02-28 LAB — TYPE AND SCREEN
ABO/RH(D): O POS
Antibody Screen: NEGATIVE

## 2022-02-28 LAB — GLUCOSE, CAPILLARY: Glucose-Capillary: 98 mg/dL (ref 70–99)

## 2022-02-28 NOTE — Pre-Procedure Instructions (Signed)
Surgical Instructions ? ? ? Your procedure is scheduled on Tuesday, March 21st. ? Report to [facility] Main Entrance "A" at 07:00 A.M., then check in with the Admitting office. ? Call this number if you have problems the morning of surgery: ? [phone] ? ? If you have any questions prior to your surgery date call [phone]: Open Monday-Friday 8am-4pm ? ? ? Remember: ? Do not eat or drink after midnight the night before your surgery ? ?  ?Take these medicines the morning of surgery with A SIP OF WATER  ?aspirin  ?gabapentin (NEURONTIN)  ?levothyroxine (SYNTHROID) ?Clopidogrel (PLAVIX) ? ?As of today, STOP taking any  Aleve, Naproxen, Ibuprofen, Motrin, Advil, Goody's, BC's, all herbal medications, fish oil, and all vitamins. ?                   ?Do NOT Smoke (Tobacco/Vaping) for 24 hours prior to your procedure. ? ?If you use a CPAP at night, you may bring your mask/headgear for your overnight stay. ?  ?Contacts, glasses, piercing's, hearing aid's, dentures or partials may not be worn into surgery, please bring cases for these belongings.  ?  ?For patients admitted to the hospital, discharge time will be determined by your treatment team. ?  ?Patients discharged the day of surgery will not be allowed to drive home, and someone needs to stay with them for 24 hours. ? ?NO VISITORS WILL BE ALLOWED IN PRE-OP WHERE PATIENTS ARE PREPPED FOR SURGERY.  ONLY 1 SUPPORT PERSON MAY BE PRESENT IN THE WAITING ROOM WHILE YOU ARE IN SURGERY.  IF YOU ARE TO BE ADMITTED, ONCE YOU ARE IN YOUR ROOM YOU WILL BE ALLOWED TWO (2) VISITORS. (1) VISITOR MAY STAY OVERNIGHT BUT MUST ARRIVE TO THE ROOM BY 8pm.  Minor children may have two parents present. Special consideration for safety and communication needs will be reviewed on a case by case basis. ? ? ?Special instructions:   ?[place]- Preparing For Surgery ? ?Before surgery, you can play an important role. Because skin is not sterile, your skin needs to be as free of germs as  possible. You can reduce the number of germs on your skin by washing with CHG (chlorahexidine gluconate) Soap before surgery.  CHG is an antiseptic cleaner which kills germs and bonds with the skin to continue killing germs even after washing.   ? ?Oral Hygiene is also important to reduce your risk of infection.  Remember - BRUSH YOUR TEETH THE MORNING OF SURGERY WITH YOUR REGULAR TOOTHPASTE ? ?Please do not use if you have an allergy to CHG or antibacterial soaps. If your skin becomes reddened/irritated stop using the CHG.  ?Do not shave (including legs and underarms) for at least 48 hours prior to first CHG shower. It is OK to shave your face. ? ?Please follow these instructions carefully. ?  ?Shower the NIGHT BEFORE SURGERY and the MORNING OF SURGERY ? ?If you chose to wash your hair, wash your hair first as usual with your normal shampoo. ? ?After you shampoo, rinse your hair and body thoroughly to remove the shampoo. ? ?Use CHG Soap as you would any other liquid soap. You can apply CHG directly to the skin and wash gently with a scrungie or a clean washcloth.  ? ?Apply the CHG Soap to your body ONLY FROM THE NECK DOWN.  Do not use on open wounds or open sores. Avoid contact with your eyes, ears, mouth and genitals (private parts). Wash Face and genitals (private  parts)  with your normal soap.  ? ?Wash thoroughly, paying special attention to the area where your surgery will be performed. ? ?Thoroughly rinse your body with warm water from the neck down. ? ?DO NOT shower/wash with your normal soap after using and rinsing off the CHG Soap. ? ?Pat yourself dry with a CLEAN TOWEL. ? ?Wear CLEAN PAJAMAS to bed the night before surgery ? ?Place CLEAN SHEETS on your bed the night before your surgery ? ?DO NOT SLEEP WITH PETS. ? ? ?Day of Surgery: ?Take a shower with CHG soap. ?Do not wear jewelry  ?Do not wear lotions, powders, colognes, or deodorant. ?Do not shave 48 hours prior to surgery.  Men may shave face and  neck. ?Do not bring valuables to the hospital. [facility] is not responsible for any belongings or valuables. ? ?Wear Clean/Comfortable clothing the morning of surgery ?Remember to brush your teeth WITH YOUR REGULAR TOOTHPASTE. ?  ?Please read over the following fact sheets that you were given. ? ? ?3 days prior to your procedure or After your COVID test ? ?? You are not required to quarantine however you are required to wear a well-fitting mask when you are out and around people not in your household. If your mask becomes wet or soiled, replace with a new one. ? ?? Wash your hands often with soap and water for 20 seconds or clean your hands with an alcohol-based hand sanitizer that contains at least 60% alcohol. ? ?? Do not share personal items. ? ?? Notify your provider: ? ?o if you are in close contact with someone who has COVID ? ?o or if you develop a fever of 100.4 or greater, sneezing, cough, sore throat, shortness of breath or body aches.   ?

## 2022-02-28 NOTE — Progress Notes (Signed)
PCP: Dr. Nelda Bucks ?Cardiologist: denies ? ?EKG: 02/21/22 ?CXR: n/a ?ECHO: 02/21/22 ?Stress Test: denies ?Cardiac Cath: denies ? ?Covid TEST: NEEDS DOS, too soon to test today, hardship to schedule another appt around dialysis ? ?Denies being a Diabetic--reports the [place] hospital had it erroneously in his records ? ?Patient denies shortness of breath, fever, cough, and chest pain at PAT appointment. ? ?Patient verbalized understanding of instructions provided today at the PAT appointment.  Patient asked to review instructions at home and day of surgery.  ? ?Will forward the chart to anesthesia ? ?Urine sample not collected, per pt, he doesn't make urine ? ?

## 2022-02-28 NOTE — Progress Notes (Signed)
New Covid protocol: Covid test not needed DOS, new arrival time 8am on March 21.  Called pt, left message. ?

## 2022-03-01 ENCOUNTER — Encounter (HOSPITAL_COMMUNITY): Payer: Self-pay

## 2022-03-01 NOTE — Anesthesia Preprocedure Evaluation (Addendum)
Anesthesia Evaluation  ?Patient identified by MRN, date of birth, ID band ?Patient awake ? ? ? ?Reviewed: ?Allergy & Precautions, H&P , NPO status , Patient's Chart, lab work & pertinent test results ? ?Airway ?Mallampati: III ? ?TM Distance: >3 FB ?Neck ROM: Full ? ? ? Dental ?no notable dental hx. ?(+) Edentulous Upper, Partial Lower, Dental Advisory Given ?  ?Pulmonary ?neg pulmonary ROS, former smoker,  ?  ?Pulmonary exam normal ?breath sounds clear to auscultation ? ? ? ? ? ? Cardiovascular ?hypertension, + Valvular Problems/Murmurs AS  ?Rhythm:Regular Rate:Normal ?+ Systolic murmurs ? ?  ?Neuro/Psych ? Headaches, CVA negative psych ROS  ? GI/Hepatic ?Neg liver ROS, GERD  Medicated,  ?Endo/Other  ?Hypothyroidism  ? Renal/GU ?ESRF and DialysisRenal disease  ?negative genitourinary ?  ?Musculoskeletal ? ?(+) Arthritis , Osteoarthritis,   ? Abdominal ?  ?Peds ? Hematology ? ?(+) Blood dyscrasia, anemia ,   ?Anesthesia Other Findings ? ? Reproductive/Obstetrics ?negative OB ROS ? ?  ? ? ? ? ? ? ? ? ? ? ? ? ? ?  ?  ? ? ? ? ? ? ?Anesthesia Physical ?Anesthesia Plan ? ?ASA: 4 ? ?Anesthesia Plan: General  ? ?Post-op Pain Management: Tylenol PO (pre-op)*  ? ?Induction: Intravenous ? ?PONV Risk Score and Plan: 3 and Ondansetron, Dexamethasone and Treatment may vary due to age or medical condition ? ?Airway Management Planned: Oral ETT ? ?Additional Equipment: Arterial line ? ?Intra-op Plan:  ? ?Post-operative Plan: Extubation in OR ? ?Informed Consent: I have reviewed the patients History and Physical, chart, labs and discussed the procedure including the risks, benefits and alternatives for the proposed anesthesia with the patient or authorized representative who has indicated his/her understanding and acceptance.  ? ? ? ?Dental advisory given ? ?Plan Discussed with: CRNA ? ?Anesthesia Plan Comments: (See APP note by Durel Salts, FNP )  ? ? ? ? ? ?Anesthesia Quick Evaluation ? ?

## 2022-03-01 NOTE — Progress Notes (Signed)
Anesthesia Chart Review: ? ? Case: [id number] Date/Time: 03/07/22 0953  ? Procedure: LEFT CAROTID ENDARTERECTOMY (Left)  ? Anesthesia type: General  ? Pre-op diagnosis: Symptomatic left carotid stenosis  ? Location: [facility] OR ROOM 16 / [facility] OR  ? Surgeons: Waynetta Sandy, MD  ? ?  ? ? ?DISCUSSION: ?Pt is 88 years old with hx HTN, ESRD on hemodialysis. Aortic stenosis (mod-severe by 02/20/22 echo) ? ?Hospitalized 3/6-8/23 for intermittent dizziness. MRI brain showed possible tiny/subacute infarct. Symptomatic L ICA stenosis found on CTA. Vascular surgery evaluated pt and outpatient CEA planned.  ? ?Pt to continue ASA and plavix perioperatively ? ? ?VS: BP (!) 152/83   Pulse 73   Temp 36.6 ?C (Oral)   Resp 17   Ht '5\' 9"'$  (1.753 m)   Wt 87.4 kg   SpO2 100%   BMI 28.44 kg/m?  ? ?PROVIDERS: ?- PCP is Nicoletta Dress, MD ? ? ?LABS: Labs reviewed: Acceptable for surgery. ?(all labs ordered are listed, but only abnormal results are displayed) ? ?Labs Reviewed  ?CBC - Abnormal; Notable for the following components:  ?    Result Value  ? RBC 3.31 (*)   ? Hemoglobin 11.3 (*)   ? HCT 35.3 (*)   ? MCV 106.6 (*)   ? MCH 34.1 (*)   ? All other components within normal limits  ?COMPREHENSIVE METABOLIC PANEL - Abnormal; Notable for the following components:  ? BUN 33 (*)   ? Creatinine, Ser 6.84 (*)   ? Albumin 3.2 (*)   ? GFR, Estimated 7 (*)   ? All other components within normal limits  ?SURGICAL PCR SCREEN  ?GLUCOSE, CAPILLARY  ?PROTIME-INR  ?APTT  ?TYPE AND SCREEN  ? ? ? ?IMAGES: ?CTA head 02/20/22:  ?1. No intracranial large vessel occlusion or proximal high-grade arterial stenosis. ?2. Atherosclerotic plaque within the intracranial internal carotid arteries and vertebral arteries, with no more than mild stenosis at these sites. ? ?CTA neck 02/20/22:  ?1. The common carotid and internal carotid arteries are patent within the neck. However, there is prominent soft and calcified plaque about the carotid bifurcations and  within the proximal ICAs, bilaterally. Resultant severe, near-occlusive stenosis at the origin of the left ICA (greater than 90%). There is also severe stenosis at the origin of the right ICA (greater than 70%). Additionally, there is severe, near-occlusive stenosis at the origin of the right external carotid artery. ?2. Vertebral arteries codominant and patent within the neck without significant stenosis. ?3.  Aortic Atherosclerosis (ICD10-I70.0). ?4. Advanced cervical spondylosis. ?5. Cervicothoracic spinal curvature as described. ?6. C7-T1 grade 1 anterolisthesis. ?  ? ?EKG 02/20/22:  ?Sinus rhythm ?Supraventricular bigeminy ?Prolonged PR interval ?Nonspecific IVCD with LAD ? ?CV: ?Echo 02/21/22:  ?1. LV apical false tendon (normal variant). Left ventricular ejection fraction, by estimation, is 60 to 65%. Left ventricular ejection fraction by PLAX is 64 %. The left ventricle has normal function. The left ventricle has no regional wall motion abnormalities. There is mild left ventricular hypertrophy. Left ventricular diastolic parameters are consistent with Grade I diastolic dysfunction (impaired relaxation).  ?2. Right ventricular systolic function is normal. The right ventricular size is normal. There is normal pulmonary artery systolic pressure. The estimated right ventricular systolic pressure is 14.7 mmHg.  ?3. Left atrial size was moderately dilated.  ?4. Right atrial size was mildly dilated.  ?5. The mitral valve is abnormal. Mild to moderate mitral valve regurgitation. Moderate mitral annular calcification.  ?6. AI is eccentric and posterior  directed at the anterior leaflet of the mitral valve. The aortic valve is tricuspid. There is moderate calcification of the aortic valve. There is moderate thickening of the aortic valve. Aortic valve regurgitation is mild. Moderate to severe aortic valve stenosis. Aortic valve area, by VTI measures 1.04 cmNoahMarland Kitchen Aortic valve mean gradient measures 26.3 mmHg. Aortic  ?valve  Vmax measures 3.26 m/s.  ?7. Aortic dilatation noted. There is borderline dilatation of the ascending aorta, measuring 38 mm.  ?8. The inferior vena cava is normal in size with <50% respiratory variability, suggesting right atrial pressure of 8 mmHg.  ?9. Evidence of atrial level shunting detected by color flow Doppler. There is a moderately sized patent foramen ovale with predominantly left to right shunting across the atrial septum.  ? ? ?Past Medical History:  ?Diagnosis Date  ? Aortic stenosis   ? mod-severe by 02/20/22 echo  ? Arthritis   ? Cancer [facility]) 2004  ? prostate  ? Dialysis patient [facility])   ? M-W-F  ? ESRD (end stage renal disease) ([place])   ? GERD (gastroesophageal reflux disease)   ? Hypertension   ? Hypothyroidism   ? Kidney disease   ? Stroke [facility]) 02/2022  ? ? ?Past Surgical History:  ?Procedure Laterality Date  ? APPENDECTOMY    ? AV FISTULA PLACEMENT Left 2018  ? CATARACT EXTRACTION, BILATERAL Bilateral   ? EYE SURGERY    ? PROSTATE SURGERY    ? TONSILLECTOMY    ? ? ?MEDICATIONS: ? aspirin 81 MG EC tablet  ? b complex-C-folic acid 1 MG capsule  ? Cinacalcet HCl (SENSIPAR PO)  ? clopidogrel (PLAVIX) 75 MG tablet  ? ethyl chloride spray  ? famotidine (PEPCID) 40 MG tablet  ? gabapentin (NEURONTIN) 100 MG capsule  ? heparin 1000 unit/mL SOLN injection  ? levothyroxine (SYNTHROID) 88 MCG tablet  ? lidocaine (XYLOCAINE) 5 % ointment  ? linaclotide (LINZESS) 290 MCG CAPS capsule  ? melatonin 3 MG TABS tablet  ? Menthol-Methyl Salicylate (THERA-GESIC) 0.5-15 % CREA  ? Methoxy PEG-Epoetin Beta (MIRCERA IJ)  ? Omega-3 Fatty Acids (FISH OIL) 1000 MG CAPS  ? polyethylene glycol powder (GLYCOLAX/MIRALAX) 17 GM/SCOOP powder  ? rosuvastatin (CRESTOR) 20 MG tablet  ? senna-docusate (SENOKOT-S) 8.6-50 MG tablet  ? sevelamer carbonate (RENVELA) 800 MG tablet  ? ?No current facility-administered medications for this encounter.  ? ? ?If no changes, I anticipate pt can proceed with surgery as scheduled.  ? ?Willeen Cass,  PhD, FNP-BC ?[facility]/Anesthesiology ?Phone: [phone] ?03/01/2022 12:02 PM ? ? ? ? ? ? ?

## 2022-03-02 ENCOUNTER — Telehealth: Payer: Self-pay

## 2022-03-02 DIAGNOSIS — I639 Cerebral infarction, unspecified: Secondary | ICD-10-CM | POA: Diagnosis not present

## 2022-03-02 DIAGNOSIS — Z992 Dependence on renal dialysis: Secondary | ICD-10-CM | POA: Diagnosis not present

## 2022-03-02 DIAGNOSIS — N186 End stage renal disease: Secondary | ICD-10-CM | POA: Diagnosis not present

## 2022-03-02 DIAGNOSIS — R42 Dizziness and giddiness: Secondary | ICD-10-CM | POA: Diagnosis not present

## 2022-03-02 DIAGNOSIS — I6523 Occlusion and stenosis of bilateral carotid arteries: Secondary | ICD-10-CM | POA: Diagnosis not present

## 2022-03-02 NOTE — Telephone Encounter (Signed)
Received call from [place], [place] @ dialysis center, had concerns bleeding from L arm AVF d/t pt being on ASA and plavix. Stated pt had been "bleeding from fistula at home" and is having "ozzing" while at dialysis. She wanted to know if it the surgeon would ok, stopping one of the anticoagulants. Spoke with Dr. Donzetta Matters and he gave the verbal ok to stop the plavix. Called Katie back and gave instructions. Joellen Jersey stated that she would call the pt and make him aware.  ?

## 2022-03-07 ENCOUNTER — Encounter (HOSPITAL_COMMUNITY)
Admission: RE | Disposition: A | Discharge status: Home / Self Care | Payer: Self-pay | Source: Home / Self Care | Attending: Vascular Surgery

## 2022-03-07 ENCOUNTER — Inpatient Hospital Stay (HOSPITAL_COMMUNITY): Payer: No Typology Code available for payment source | Admitting: Emergency Medicine

## 2022-03-07 ENCOUNTER — Inpatient Hospital Stay (HOSPITAL_COMMUNITY)
Admission: RE | Admit: 2022-03-07 | Discharge: 2022-03-08 | DRG: 037 | Disposition: A | Discharge status: Home / Self Care | Payer: No Typology Code available for payment source | Attending: Vascular Surgery | Admitting: Vascular Surgery

## 2022-03-07 ENCOUNTER — Other Ambulatory Visit: Payer: Self-pay

## 2022-03-07 ENCOUNTER — Encounter (HOSPITAL_COMMUNITY): Payer: Self-pay | Admitting: Vascular Surgery

## 2022-03-07 DIAGNOSIS — E889 Metabolic disorder, unspecified: Secondary | ICD-10-CM | POA: Diagnosis present

## 2022-03-07 DIAGNOSIS — Z8673 Personal history of transient ischemic attack (TIA), and cerebral infarction without residual deficits: Secondary | ICD-10-CM

## 2022-03-07 DIAGNOSIS — Z8249 Family history of ischemic heart disease and other diseases of the circulatory system: Secondary | ICD-10-CM | POA: Diagnosis not present

## 2022-03-07 DIAGNOSIS — Z79899 Other long term (current) drug therapy: Secondary | ICD-10-CM

## 2022-03-07 DIAGNOSIS — Z7902 Long term (current) use of antithrombotics/antiplatelets: Secondary | ICD-10-CM

## 2022-03-07 DIAGNOSIS — N2581 Secondary hyperparathyroidism of renal origin: Secondary | ICD-10-CM | POA: Diagnosis present

## 2022-03-07 DIAGNOSIS — I6522 Occlusion and stenosis of left carotid artery: Secondary | ICD-10-CM | POA: Diagnosis not present

## 2022-03-07 DIAGNOSIS — Z87891 Personal history of nicotine dependence: Secondary | ICD-10-CM

## 2022-03-07 DIAGNOSIS — Z8546 Personal history of malignant neoplasm of prostate: Secondary | ICD-10-CM

## 2022-03-07 DIAGNOSIS — K219 Gastro-esophageal reflux disease without esophagitis: Secondary | ICD-10-CM | POA: Diagnosis present

## 2022-03-07 DIAGNOSIS — Z992 Dependence on renal dialysis: Secondary | ICD-10-CM | POA: Diagnosis not present

## 2022-03-07 DIAGNOSIS — Z79890 Hormone replacement therapy (postmenopausal): Secondary | ICD-10-CM | POA: Diagnosis not present

## 2022-03-07 DIAGNOSIS — I350 Nonrheumatic aortic (valve) stenosis: Secondary | ICD-10-CM | POA: Diagnosis present

## 2022-03-07 DIAGNOSIS — I120 Hypertensive chronic kidney disease with stage 5 chronic kidney disease or end stage renal disease: Secondary | ICD-10-CM | POA: Diagnosis present

## 2022-03-07 DIAGNOSIS — Z880 Allergy status to penicillin: Secondary | ICD-10-CM | POA: Diagnosis not present

## 2022-03-07 DIAGNOSIS — D631 Anemia in chronic kidney disease: Secondary | ICD-10-CM | POA: Diagnosis present

## 2022-03-07 DIAGNOSIS — Z87442 Personal history of urinary calculi: Secondary | ICD-10-CM | POA: Diagnosis not present

## 2022-03-07 DIAGNOSIS — E039 Hypothyroidism, unspecified: Secondary | ICD-10-CM | POA: Diagnosis present

## 2022-03-07 DIAGNOSIS — H903 Sensorineural hearing loss, bilateral: Secondary | ICD-10-CM | POA: Diagnosis present

## 2022-03-07 DIAGNOSIS — E785 Hyperlipidemia, unspecified: Secondary | ICD-10-CM | POA: Diagnosis present

## 2022-03-07 DIAGNOSIS — Z888 Allergy status to other drugs, medicaments and biological substances status: Secondary | ICD-10-CM

## 2022-03-07 DIAGNOSIS — Z86010 Personal history of colonic polyps: Secondary | ICD-10-CM

## 2022-03-07 DIAGNOSIS — I6523 Occlusion and stenosis of bilateral carotid arteries: Secondary | ICD-10-CM | POA: Diagnosis present

## 2022-03-07 DIAGNOSIS — Z7982 Long term (current) use of aspirin: Secondary | ICD-10-CM | POA: Diagnosis not present

## 2022-03-07 DIAGNOSIS — I63232 Cerebral infarction due to unspecified occlusion or stenosis of left carotid arteries: Secondary | ICD-10-CM | POA: Diagnosis not present

## 2022-03-07 DIAGNOSIS — N186 End stage renal disease: Secondary | ICD-10-CM

## 2022-03-07 DIAGNOSIS — M1990 Unspecified osteoarthritis, unspecified site: Secondary | ICD-10-CM | POA: Diagnosis present

## 2022-03-07 DIAGNOSIS — I6529 Occlusion and stenosis of unspecified carotid artery: Secondary | ICD-10-CM | POA: Diagnosis present

## 2022-03-07 HISTORY — DX: End stage renal disease: Z99.2

## 2022-03-07 HISTORY — PX: ENDARTERECTOMY: SHX5162

## 2022-03-07 HISTORY — DX: End stage renal disease: N18.6

## 2022-03-07 HISTORY — PX: PATCH ANGIOPLASTY: SHX6230

## 2022-03-07 LAB — ABO/RH: ABO/RH(D): O POS

## 2022-03-07 LAB — POCT I-STAT, CHEM 8
BUN: 38 mg/dL — ABNORMAL HIGH (ref 8–23)
Calcium, Ion: 1.11 mmol/L — ABNORMAL LOW (ref 1.15–1.40)
Chloride: 96 mmol/L — ABNORMAL LOW (ref 98–111)
Creatinine, Ser: 7 mg/dL — ABNORMAL HIGH (ref 0.61–1.24)
Glucose, Bld: 95 mg/dL (ref 70–99)
HCT: 37 % — ABNORMAL LOW (ref 39.0–52.0)
Hemoglobin: 12.6 g/dL — ABNORMAL LOW (ref 13.0–17.0)
Potassium: 4 mmol/L (ref 3.5–5.1)
Sodium: 138 mmol/L (ref 135–145)
TCO2: 30 mmol/L (ref 22–32)

## 2022-03-07 LAB — PHOSPHORUS: Phosphorus: 5.2 mg/dL — ABNORMAL HIGH (ref 2.5–4.6)

## 2022-03-07 LAB — POCT ACTIVATED CLOTTING TIME: Activated Clotting Time: 323 seconds

## 2022-03-07 SURGERY — ENDARTERECTOMY, CAROTID
Anesthesia: General | Site: Neck | Laterality: Left

## 2022-03-07 MED ORDER — FENTANYL CITRATE (PF) 250 MCG/5ML IJ SOLN
INTRAMUSCULAR | Status: DC | PRN
  Administered 2022-03-07 (×2): 50 ug via INTRAVENOUS

## 2022-03-07 MED ORDER — HEPARIN 6000 UNIT IRRIGATION SOLUTION
Status: DC | PRN
  Administered 2022-03-07: 1

## 2022-03-07 MED ORDER — CHLORHEXIDINE GLUCONATE 0.12 % MT SOLN
15.0000 mL | Freq: Once | OROMUCOSAL | Status: AC
  Administered 2022-03-07: 15 mL via OROMUCOSAL
  Filled 2022-03-07: qty 15

## 2022-03-07 MED ORDER — ACETAMINOPHEN 325 MG PO TABS
325.0000 mg | ORAL_TABLET | ORAL | Status: DC | PRN
  Administered 2022-03-07: 650 mg via ORAL
  Filled 2022-03-07: qty 2

## 2022-03-07 MED ORDER — ONDANSETRON HCL 4 MG/2ML IJ SOLN
INTRAMUSCULAR | Status: DC | PRN
  Administered 2022-03-07: 4 mg via INTRAVENOUS

## 2022-03-07 MED ORDER — LABETALOL HCL 5 MG/ML IV SOLN: 10.0000 mg | INTRAVENOUS | Status: DC | PRN

## 2022-03-07 MED ORDER — PROTAMINE SULFATE 10 MG/ML IV SOLN
INTRAVENOUS | Status: DC | PRN
  Administered 2022-03-07 (×3): 10 mg via INTRAVENOUS
  Administered 2022-03-07: 20 mg via INTRAVENOUS

## 2022-03-07 MED ORDER — EPHEDRINE 5 MG/ML INJ
INTRAVENOUS | Status: AC
  Filled 2022-03-07: qty 5

## 2022-03-07 MED ORDER — DOCUSATE SODIUM 100 MG PO CAPS: 100.0000 mg | ORAL_CAPSULE | Freq: Every day | ORAL | Status: DC

## 2022-03-07 MED ORDER — CHLORHEXIDINE GLUCONATE CLOTH 2 % EX PADS
6.0000 | MEDICATED_PAD | Freq: Once | CUTANEOUS | Status: DC
  Administered 2022-03-07: 6 via TOPICAL

## 2022-03-07 MED ORDER — SEVELAMER CARBONATE 800 MG PO TABS
1600.0000 mg | ORAL_TABLET | Freq: Three times a day (TID) | ORAL | Status: DC
  Administered 2022-03-08: 1600 mg via ORAL
  Filled 2022-03-07: qty 2

## 2022-03-07 MED ORDER — ORAL CARE MOUTH RINSE: 15.0000 mL | Freq: Once | OROMUCOSAL | Status: AC

## 2022-03-07 MED ORDER — HEPARIN SODIUM (PORCINE) 1000 UNIT/ML IJ SOLN
INTRAMUSCULAR | Status: DC | PRN
  Administered 2022-03-07: 8000 [IU] via INTRAVENOUS

## 2022-03-07 MED ORDER — FENTANYL CITRATE (PF) 100 MCG/2ML IJ SOLN
INTRAMUSCULAR | Status: AC
  Filled 2022-03-07: qty 2

## 2022-03-07 MED ORDER — CHLORHEXIDINE GLUCONATE CLOTH 2 % EX PADS
6.0000 | MEDICATED_PAD | Freq: Every day | CUTANEOUS | Status: DC
  Administered 2022-03-08: 6 via TOPICAL

## 2022-03-07 MED ORDER — ROSUVASTATIN CALCIUM 20 MG PO TABS
20.0000 mg | ORAL_TABLET | Freq: Every day | ORAL | Status: DC
  Administered 2022-03-07: 20 mg via ORAL
  Filled 2022-03-07: qty 1

## 2022-03-07 MED ORDER — ONDANSETRON HCL 4 MG/2ML IJ SOLN: 4.0000 mg | Freq: Four times a day (QID) | INTRAMUSCULAR | Status: DC | PRN

## 2022-03-07 MED ORDER — MAGNESIUM SULFATE 2 GM/50ML IV SOLN: 2.0000 g | Freq: Every day | INTRAVENOUS | Status: DC | PRN

## 2022-03-07 MED ORDER — METOPROLOL TARTRATE 5 MG/5ML IV SOLN: 2.0000 mg | INTRAVENOUS | Status: DC | PRN

## 2022-03-07 MED ORDER — VANCOMYCIN HCL IN DEXTROSE 1-5 GM/200ML-% IV SOLN
1000.0000 mg | INTRAVENOUS | Status: AC
  Administered 2022-03-07: 1000 mg via INTRAVENOUS
  Filled 2022-03-07: qty 200

## 2022-03-07 MED ORDER — LIDOCAINE 2% (20 MG/ML) 5 ML SYRINGE
INTRAMUSCULAR | Status: AC
  Filled 2022-03-07: qty 5

## 2022-03-07 MED ORDER — FAMOTIDINE 20 MG PO TABS: 40.0000 mg | ORAL_TABLET | Freq: Every evening | ORAL | Status: DC | PRN

## 2022-03-07 MED ORDER — SUGAMMADEX SODIUM 200 MG/2ML IV SOLN
INTRAVENOUS | Status: DC | PRN
  Administered 2022-03-07: 200 mg via INTRAVENOUS

## 2022-03-07 MED ORDER — LIDOCAINE 2% (20 MG/ML) 5 ML SYRINGE
INTRAMUSCULAR | Status: DC | PRN
  Administered 2022-03-07: 60 mg via INTRAVENOUS

## 2022-03-07 MED ORDER — FENTANYL CITRATE (PF) 100 MCG/2ML IJ SOLN
25.0000 ug | INTRAMUSCULAR | Status: DC | PRN
  Administered 2022-03-07: 25 ug via INTRAVENOUS

## 2022-03-07 MED ORDER — VANCOMYCIN HCL IN DEXTROSE 1-5 GM/200ML-% IV SOLN
1000.0000 mg | Freq: Two times a day (BID) | INTRAVENOUS | Status: AC
  Administered 2022-03-07 – 2022-03-08 (×2): 1000 mg via INTRAVENOUS
  Filled 2022-03-07 (×3): qty 200

## 2022-03-07 MED ORDER — ACETAMINOPHEN 650 MG RE SUPP: 325.0000 mg | RECTAL | Status: DC | PRN

## 2022-03-07 MED ORDER — PROPOFOL 10 MG/ML IV BOLUS
INTRAVENOUS | Status: AC
  Filled 2022-03-07: qty 20

## 2022-03-07 MED ORDER — ROCURONIUM BROMIDE 10 MG/ML (PF) SYRINGE
PREFILLED_SYRINGE | INTRAVENOUS | Status: AC
  Filled 2022-03-07: qty 10

## 2022-03-07 MED ORDER — HYDRALAZINE HCL 20 MG/ML IJ SOLN: 5.0000 mg | INTRAMUSCULAR | Status: DC | PRN

## 2022-03-07 MED ORDER — LIDOCAINE 5 % EX OINT
1.0000 "application " | TOPICAL_OINTMENT | Freq: Two times a day (BID) | CUTANEOUS | Status: DC | PRN
  Filled 2022-03-07: qty 35.44

## 2022-03-07 MED ORDER — LABETALOL HCL 5 MG/ML IV SOLN
INTRAVENOUS | Status: DC | PRN
  Administered 2022-03-07: 10 mg via INTRAVENOUS

## 2022-03-07 MED ORDER — POLYETHYLENE GLYCOL 3350 17 G PO PACK: 17.0000 g | PACK | Freq: Two times a day (BID) | ORAL | Status: DC | PRN

## 2022-03-07 MED ORDER — PANTOPRAZOLE SODIUM 40 MG PO TBEC: 40.0000 mg | DELAYED_RELEASE_TABLET | Freq: Every day | ORAL | Status: DC

## 2022-03-07 MED ORDER — DEXTRAN 40 IN SALINE 10-0.9 % IV SOLN
INTRAVENOUS | Status: AC | PRN
  Administered 2022-03-07: 25 mL/h

## 2022-03-07 MED ORDER — DEXAMETHASONE SODIUM PHOSPHATE 10 MG/ML IJ SOLN
INTRAMUSCULAR | Status: DC | PRN
  Administered 2022-03-07: 10 mg via INTRAVENOUS

## 2022-03-07 MED ORDER — PHENYLEPHRINE HCL-NACL 20-0.9 MG/250ML-% IV SOLN
INTRAVENOUS | Status: DC | PRN
  Administered 2022-03-07: 50 ug/min via INTRAVENOUS

## 2022-03-07 MED ORDER — ONDANSETRON HCL 4 MG/2ML IJ SOLN
INTRAMUSCULAR | Status: AC
  Filled 2022-03-07: qty 2

## 2022-03-07 MED ORDER — SODIUM CHLORIDE 0.9 % IV SOLN
INTRAVENOUS | Status: DC
Start: 2022-03-07 — End: 2022-03-07

## 2022-03-07 MED ORDER — CINACALCET HCL 30 MG PO TABS
30.0000 mg | ORAL_TABLET | ORAL | Status: DC
  Administered 2022-03-08: 30 mg via ORAL
  Filled 2022-03-07: qty 1

## 2022-03-07 MED ORDER — LIDOCAINE HCL (PF) 1 % IJ SOLN
INTRAMUSCULAR | Status: AC
  Filled 2022-03-07: qty 30

## 2022-03-07 MED ORDER — ROCURONIUM BROMIDE 10 MG/ML (PF) SYRINGE
PREFILLED_SYRINGE | INTRAVENOUS | Status: DC | PRN
  Administered 2022-03-07 (×2): 50 mg via INTRAVENOUS

## 2022-03-07 MED ORDER — 0.9 % SODIUM CHLORIDE (POUR BTL) OPTIME
TOPICAL | Status: DC | PRN
  Administered 2022-03-07: 1000 mL

## 2022-03-07 MED ORDER — MUSCLE RUB 10-15 % EX CREA
1.0000 "application " | TOPICAL_CREAM | Freq: Four times a day (QID) | CUTANEOUS | Status: DC | PRN
  Filled 2022-03-07: qty 85

## 2022-03-07 MED ORDER — MELATONIN 3 MG PO TABS
3.0000 mg | ORAL_TABLET | Freq: Every evening | ORAL | Status: DC | PRN
  Administered 2022-03-07: 3 mg via ORAL
  Filled 2022-03-07: qty 1

## 2022-03-07 MED ORDER — GUAIFENESIN-DM 100-10 MG/5ML PO SYRP: 15.0000 mL | ORAL_SOLUTION | ORAL | Status: DC | PRN

## 2022-03-07 MED ORDER — CHLORHEXIDINE GLUCONATE CLOTH 2 % EX PADS: 6.0000 | MEDICATED_PAD | Freq: Once | CUTANEOUS | Status: DC

## 2022-03-07 MED ORDER — ASPIRIN EC 81 MG PO TBEC: 81.0000 mg | DELAYED_RELEASE_TABLET | Freq: Every day | ORAL | Status: DC

## 2022-03-07 MED ORDER — HEPARIN 6000 UNIT IRRIGATION SOLUTION
Status: AC
  Filled 2022-03-07: qty 500

## 2022-03-07 MED ORDER — FENTANYL CITRATE (PF) 250 MCG/5ML IJ SOLN
INTRAMUSCULAR | Status: AC
  Filled 2022-03-07: qty 5

## 2022-03-07 MED ORDER — SODIUM CHLORIDE 0.9 % IV SOLN
62.5000 mg | INTRAVENOUS | Status: DC
  Administered 2022-03-08: 62.5 mg via INTRAVENOUS
  Filled 2022-03-07 (×2): qty 5

## 2022-03-07 MED ORDER — ACETAMINOPHEN 500 MG PO TABS
1000.0000 mg | ORAL_TABLET | Freq: Once | ORAL | Status: AC
  Administered 2022-03-07: 1000 mg via ORAL
  Filled 2022-03-07: qty 2

## 2022-03-07 MED ORDER — LINACLOTIDE 145 MCG PO CAPS
290.0000 ug | ORAL_CAPSULE | Freq: Every day | ORAL | Status: DC | PRN
  Filled 2022-03-07: qty 2

## 2022-03-07 MED ORDER — CALCITRIOL 0.25 MCG PO CAPS
1.5000 ug | ORAL_CAPSULE | ORAL | Status: DC
  Administered 2022-03-08: 1.5 ug via ORAL
  Filled 2022-03-07: qty 6
  Filled 2022-03-07 (×2): qty 3

## 2022-03-07 MED ORDER — PHENOL 1.4 % MT LIQD: 1.0000 | OROMUCOSAL | Status: DC | PRN

## 2022-03-07 MED ORDER — SODIUM CHLORIDE 0.9 % IV SOLN
0.0125 ug/kg/min | INTRAVENOUS | Status: DC
  Administered 2022-03-07: .2 ug/kg/min via INTRAVENOUS
  Filled 2022-03-07 (×2): qty 2000

## 2022-03-07 MED ORDER — POTASSIUM CHLORIDE CRYS ER 20 MEQ PO TBCR: 20.0000 meq | EXTENDED_RELEASE_TABLET | Freq: Every day | ORAL | Status: DC | PRN

## 2022-03-07 MED ORDER — SENNOSIDES-DOCUSATE SODIUM 8.6-50 MG PO TABS
2.0000 | ORAL_TABLET | Freq: Every day | ORAL | Status: DC
  Administered 2022-03-07: 2 via ORAL
  Filled 2022-03-07: qty 2

## 2022-03-07 MED ORDER — GABAPENTIN 100 MG PO CAPS
100.0000 mg | ORAL_CAPSULE | Freq: Three times a day (TID) | ORAL | Status: DC
  Administered 2022-03-07: 100 mg via ORAL
  Filled 2022-03-07: qty 1

## 2022-03-07 MED ORDER — HEMOSTATIC AGENTS (NO CHARGE) OPTIME
TOPICAL | Status: DC | PRN
  Administered 2022-03-07: 1 via TOPICAL

## 2022-03-07 MED ORDER — LEVOTHYROXINE SODIUM 88 MCG PO TABS
88.0000 ug | ORAL_TABLET | Freq: Every day | ORAL | Status: DC
  Administered 2022-03-08: 88 ug via ORAL
  Filled 2022-03-07: qty 1

## 2022-03-07 MED ORDER — PROTAMINE SULFATE 10 MG/ML IV SOLN
INTRAVENOUS | Status: AC
  Filled 2022-03-07: qty 5

## 2022-03-07 MED ORDER — HYDROMORPHONE HCL 1 MG/ML IJ SOLN: 0.5000 mg | INTRAMUSCULAR | Status: DC | PRN

## 2022-03-07 MED ORDER — OXYCODONE-ACETAMINOPHEN 5-325 MG PO TABS
1.0000 | ORAL_TABLET | ORAL | Status: DC | PRN
  Administered 2022-03-07: 1 via ORAL
  Filled 2022-03-07: qty 1

## 2022-03-07 MED ORDER — SODIUM CHLORIDE 0.9 % IV SOLN: 500.0000 mL | Freq: Once | INTRAVENOUS | Status: DC | PRN

## 2022-03-07 MED ORDER — DEXAMETHASONE SODIUM PHOSPHATE 10 MG/ML IJ SOLN
INTRAMUSCULAR | Status: AC
  Filled 2022-03-07: qty 1

## 2022-03-07 MED ORDER — PROPOFOL 10 MG/ML IV BOLUS
INTRAVENOUS | Status: DC | PRN
  Administered 2022-03-07 (×2): 50 mg via INTRAVENOUS

## 2022-03-07 MED ORDER — EPHEDRINE SULFATE-NACL 50-0.9 MG/10ML-% IV SOSY
PREFILLED_SYRINGE | INTRAVENOUS | Status: DC | PRN
  Administered 2022-03-07: 5 mg via INTRAVENOUS
  Administered 2022-03-07: 10 mg via INTRAVENOUS

## 2022-03-07 SURGICAL SUPPLY — 54 items
BAG COUNTER SPONGE SURGICOUNT (BAG) ×2 IMPLANT
BAG DECANTER FOR FLEXI CONT (MISCELLANEOUS) ×2 IMPLANT
CANISTER SUCT 3000ML PPV (MISCELLANEOUS) ×2 IMPLANT
CANNULA VESSEL 3MM 2 BLNT TIP (CANNULA) ×2 IMPLANT
CATH ROBINSON RED A/P 18FR (CATHETERS) ×2 IMPLANT
CLIP LIGATING EXTRA MED SLVR (CLIP) ×3 IMPLANT
CLIP LIGATING EXTRA SM BLUE (MISCELLANEOUS) ×3 IMPLANT
COVER PROBE W GEL 5X96 (DRAPES) ×1 IMPLANT
DERMABOND ADVANCED (GAUZE/BANDAGES/DRESSINGS) ×1
DERMABOND ADVANCED .7 DNX12 (GAUZE/BANDAGES/DRESSINGS) ×1 IMPLANT
DRAIN CHANNEL 15F RND FF W/TCR (WOUND CARE) IMPLANT
DRAPE INCISE IOBAN 66X45 STRL (DRAPES) ×1 IMPLANT
DRAPE ORTHO SPLIT 77X108 STRL (DRAPES) ×1
DRAPE SURG ORHT 6 SPLT 77X108 (DRAPES) IMPLANT
ELECT REM PT RETURN 9FT ADLT (ELECTROSURGICAL) ×2
ELECTRODE REM PT RTRN 9FT ADLT (ELECTROSURGICAL) ×1 IMPLANT
EVACUATOR SILICONE 100CC (DRAIN) IMPLANT
GLOVE SURG ENC MOIS LTX SZ7.5 (GLOVE) ×2 IMPLANT
GOWN STRL REUS W/ TWL LRG LVL3 (GOWN DISPOSABLE) ×2 IMPLANT
GOWN STRL REUS W/ TWL XL LVL3 (GOWN DISPOSABLE) ×1 IMPLANT
GOWN STRL REUS W/TWL LRG LVL3 (GOWN DISPOSABLE) ×2
GOWN STRL REUS W/TWL XL LVL3 (GOWN DISPOSABLE) ×1
HEMOSTAT SNOW SURGICEL 2X4 (HEMOSTASIS) IMPLANT
INSERT FOGARTY SM (MISCELLANEOUS) IMPLANT
IV ADAPTER SYR DOUBLE MALE LL (MISCELLANEOUS) IMPLANT
KIT BASIN OR (CUSTOM PROCEDURE TRAY) ×2 IMPLANT
KIT SHUNT ARGYLE CAROTID ART 6 (VASCULAR PRODUCTS) ×2 IMPLANT
KIT TURNOVER KIT B (KITS) ×2 IMPLANT
NDL HYPO 25GX1X1/2 BEV (NEEDLE) IMPLANT
NDL SPNL 20GX3.5 QUINCKE YW (NEEDLE) IMPLANT
NEEDLE HYPO 25GX1X1/2 BEV (NEEDLE) IMPLANT
NEEDLE SPNL 20GX3.5 QUINCKE YW (NEEDLE) IMPLANT
NS IRRIG 1000ML POUR BTL (IV SOLUTION) ×6 IMPLANT
PACK CAROTID (CUSTOM PROCEDURE TRAY) ×1 IMPLANT
PACK CV ACCESS (CUSTOM PROCEDURE TRAY) ×1 IMPLANT
PAD ARMBOARD 7.5X6 YLW CONV (MISCELLANEOUS) ×4 IMPLANT
PATCH VASC XENOSURE 1CMX6CM (Vascular Products) ×1 IMPLANT
PATCH VASC XENOSURE 1X6 (Vascular Products) IMPLANT
POSITIONER HEAD DONUT 9IN (MISCELLANEOUS) ×2 IMPLANT
POWDER SURGICEL 3.0 GRAM (HEMOSTASIS) ×1 IMPLANT
STOPCOCK 4 WAY LG BORE MALE ST (IV SETS) IMPLANT
SUT ETHILON 3 0 PS 1 (SUTURE) IMPLANT
SUT MNCRL AB 4-0 PS2 18 (SUTURE) ×2 IMPLANT
SUT PROLENE 6 0 BV (SUTURE) ×5 IMPLANT
SUT SILK 3 0 (SUTURE) ×1
SUT SILK 3-0 18XBRD TIE 12 (SUTURE) IMPLANT
SUT VIC AB 3-0 SH 27 (SUTURE) ×1
SUT VIC AB 3-0 SH 27X BRD (SUTURE) ×1 IMPLANT
SYR 20ML LL LF (SYRINGE) ×1 IMPLANT
SYR CONTROL 10ML LL (SYRINGE) IMPLANT
TAPE UMBILICAL 1/8X30 (MISCELLANEOUS) ×2 IMPLANT
TOWEL GREEN STERILE (TOWEL DISPOSABLE) ×2 IMPLANT
TUBING ART PRESS 48 MALE/FEM (TUBING) IMPLANT
WATER STERILE IRR 1000ML POUR (IV SOLUTION) ×2 IMPLANT

## 2022-03-07 NOTE — Progress Notes (Signed)
?  Day of Surgery Note ? ? ? ?Subjective:  says he's a little dizzy ? ? ?Vitals:  ? 03/07/22 0750 03/07/22 1215  ?BP: (!) 188/89 (!) 150/75  ?Pulse: 75 70  ?Resp: 18 15  ?Temp: 98.5 ?F (36.9 ?C) 97.9 ?F (36.6 ?C)  ?SpO2: 94% 93%  ? ? ?Incisions:   clean and dry without hematoma ?Neuro:  moving all extremities equally; tongue is midline ?Lungs:  non labored ? ? ? ?Assessment/Plan:  This is a 88 y.o. adult who is s/p  ?Left CEA ? ?-pt doing well in recovery and neuro in tact. ?-BP goal 734-193 systolic.  Will go by cuff as there is a whip in the a line.  ?-ESRD-talked with Dr. Melvia Heaps to inform him of pt's admission to determine when to dialyze.   ?-to [facility] later today.  Hopeful for discharge tomorrow if evening is uneventful. ?-asa ordered for tomorrow.  Plavix not continued as it was discontinued prior to admission per Dr. Donzetta Matters. ? ? ?Leontine Locket, PA-C ?03/07/2022 ?12:24 PM ?[phone] ? ?

## 2022-03-07 NOTE — Interval H&P Note (Signed)
History and Physical Interval Note: ? ?03/07/2022 ?9:36 AM ? ?Noah Osborn  has presented today for surgery, with the diagnosis of Symptomatic left carotid stenosis.  The various methods of treatment have been discussed with the patient and family. After consideration of risks, benefits and other options for treatment, the patient has consented to  Procedure(s): ?LEFT CAROTID ENDARTERECTOMY (Left) as a surgical intervention.  The patient's history has been reviewed, patient examined, no change in status, stable for surgery.  I have reviewed the patient's chart and labs.  Questions were answered to the patient's satisfaction.   ? ? ?Servando Snare ? ? ?

## 2022-03-07 NOTE — Anesthesia Procedure Notes (Signed)
Procedure Name: Intubation ?Date/Time: 03/07/2022 10:25 AM ?Performed by: Lance Coon, CRNA ?Pre-anesthesia Checklist: Patient identified, Emergency Drugs available, Suction available, Patient being monitored and Timeout performed ?Patient Re-evaluated:Patient Re-evaluated prior to induction ?Oxygen Delivery Method: Circle system utilized ?Preoxygenation: Pre-oxygenation with 100% oxygen ?Induction Type: IV induction ?Ventilation: Mask ventilation without difficulty ?Laryngoscope Size: Sabra Heck and 3 ?Grade View: Grade I ?Tube type: Oral ?Tube size: 7.5 mm ?Number of attempts: 1 ?Airway Equipment and Method: Stylet ?Placement Confirmation: ETT inserted through vocal cords under direct vision, positive ETCO2 and breath sounds checked- equal and bilateral ?Secured at: 22 cm ?Tube secured with: Tape ?Dental Injury: Teeth and Oropharynx as per pre-operative assessment  ? ? ? ? ?

## 2022-03-07 NOTE — Progress Notes (Signed)
Patient arrived from PACU to 4E19. Patient with left neck incision with slight bruising and slightly puffy on the outer aspect of incision, No change per PACU RN. Vital signs obtained and patient placed on monitor. CHG bath completed.Aimy Sweeting, Bettina Gavia ?RN  ?

## 2022-03-07 NOTE — Transfer of Care (Signed)
Immediate Anesthesia Transfer of Care Note ? ?Patient: Noah Osborn ? ?Procedure(s) Performed: LEFT CAROTID ENDARTERECTOMY (Left: Neck) ?PATCH ANGIOPLASTY WITH 1 CM X 6 CM BIOLOGIC XENOSURE PATCH (Left: Neck) ? ?Patient Location: PACU ? ?Anesthesia Type:General ? ?Level of Consciousness: drowsy and patient cooperative ? ?Airway & Oxygen Therapy: Patient Spontanous Breathing ? ?Post-op Assessment: Report given to RN and Post -op Vital signs reviewed and stable ? ?Post vital signs: Reviewed and stable ? ?Last Vitals:  ?Vitals Value Taken Time  ?BP 150/75 03/07/22 1215  ?Temp    ?Pulse 71 03/07/22 1216  ?Resp 14 03/07/22 1216  ?SpO2 93 % 03/07/22 1216  ?Vitals shown include unvalidated device data. ? ?Last Pain:  ?Vitals:  ? 03/07/22 0809  ?TempSrc:   ?PainSc: 0-No pain  ?   ? ?  ? ?Complications: No notable events documented. ?

## 2022-03-07 NOTE — Anesthesia Postprocedure Evaluation (Signed)
Anesthesia Post Note ? ?Patient: Noah Osborn ? ?Procedure(s) Performed: LEFT CAROTID ENDARTERECTOMY (Left: Neck) ?PATCH ANGIOPLASTY WITH 1 CM X 6 CM BIOLOGIC XENOSURE PATCH (Left: Neck) ? ?  ? ?Patient location during evaluation: PACU ?Anesthesia Type: General ?Level of consciousness: awake and alert ?Pain management: pain level controlled ?Vital Signs Assessment: post-procedure vital signs reviewed and stable ?Respiratory status: spontaneous breathing, nonlabored ventilation and respiratory function stable ?Cardiovascular status: blood pressure returned to baseline and stable ?Postop Assessment: no apparent nausea or vomiting ?Anesthetic complications: no ? ? ?No notable events documented. ? ?Last Vitals:  ?Vitals:  ? 03/07/22 1245 03/07/22 1300  ?BP: (!) 141/72 133/65  ?Pulse: 76 78  ?Resp: 15 20  ?Temp:  36.4 ?C  ?SpO2: 94% 92%  ?  ?Last Pain:  ?Vitals:  ? 03/07/22 1230  ?TempSrc:   ?PainSc: 0-No pain  ? ? ?  ?  ?  ?  ?  ?  ? ?Natsha Guidry,W. EDMOND ? ? ? ? ?

## 2022-03-07 NOTE — Consult Note (Signed)
Renal Service ?Consult Note ?[name] ? ?Noah Noah Osborn ?03/07/2022 ?Noah Blazing, MD ?Requesting Physician: Dr. Donzetta Noah Osborn  ? ?Reason for Consult: ESRD pt being admitted after CEA ?HPI: The patient is a 88 y.o. year-old w/ hx of prostate cancer, ESRD on HD, HTN, hypothyroid, hx of CVA who was diagnosed w/ bilateral ICA stenosis w/ tight stenosis on the left. Pt referred to [facility] who admitted pt for L CEA which was done this morning.  Asked to see for ESRD.  ? ? ?Seen in room, post-op pain no other c/o's.  On HD x 4 yrs, no major issues, good compliance.  No recent access issues.  ? ? ?ROS - denies CP, no joint pain, no HA, no blurry vision, no rash, no diarrhea, no nausea/ vomiting ? ?Past Medical History  ?Past Medical History:  ?Diagnosis Date  ? Aortic stenosis   ? mod-severe by 02/20/22 echo  ? Arthritis   ? Cancer [facility]) 2004  ? prostate  ? Dialysis patient [facility])   ? M-W-F  ? ESRD (end stage renal disease) ([place])   ? GERD (gastroesophageal reflux disease)   ? Hypertension   ? Hypothyroidism   ? Kidney disease   ? Stroke [facility]) 02/2022  ? ?Past Surgical History  ?Past Surgical History:  ?Procedure Laterality Date  ? APPENDECTOMY    ? AV FISTULA PLACEMENT Left 2018  ? CATARACT EXTRACTION, BILATERAL Bilateral   ? EYE SURGERY    ? PROSTATE SURGERY    ? TONSILLECTOMY    ? ?Family History  ?Family History  ?Problem Relation Age of Onset  ? Cancer Mother   ? Heart failure Father   ? ?Social History  reports that she quit smoking about 51 years ago. Noah Osborn smoking use included cigarettes. She has quit using smokeless tobacco. She reports that she does not currently use alcohol. She reports that she does not use drugs. ?Allergies  ?Allergies  ?Allergen Reactions  ? Penicillins Anaphylaxis, Rash and Other (See Comments)  ?  Fainted like ?Fainted like ?  ? Pravastatin Other (See Comments)  ? Simvastatin Other (See Comments)  ? ?Home medications ?Prior to Admission medications   ?Medication Sig Start Date End Date  Taking? Authorizing Provider  ?aspirin 81 MG EC tablet Take 81 mg by mouth daily. 07/22/13  Yes [provider]  ?b complex-C-folic acid 1 MG capsule Take 1 capsule by mouth at bedtime. 07/19/21  Yes [provider]  ?Cinacalcet HCl (SENSIPAR PO) Take 1 capsule by mouth daily. 05/20/21 05/19/22 Yes [provider]  ?clopidogrel (PLAVIX) 75 MG tablet Take 1 tablet (75 mg total) by mouth daily. 02/22/22 03/24/22 Yes Darliss Cheney, MD  ?ethyl chloride spray 1 application. as directed. For use before Dialysis treatment 06/09/21  Yes [provider]  ?famotidine (PEPCID) 40 MG tablet Take 1 tablet by mouth at bedtime as needed for heartburn or indigestion. 06/14/17  Yes [provider]  ?gabapentin (NEURONTIN) 100 MG capsule Take 100 mg by mouth 3 (three) times daily. 12/10/19  Yes [provider]  ?levothyroxine (SYNTHROID) 88 MCG tablet Take 88 mcg by mouth daily before breakfast. 06/14/17  Yes [provider]  ?linaclotide (LINZESS) 290 MCG CAPS capsule Take 290 mcg by mouth daily as needed (constipation). 01/09/18  Yes [provider]  ?melatonin 3 MG TABS tablet Take 3 mg by mouth at bedtime as needed (sleep). 05/27/20  Yes [provider]  ?Menthol-Methyl Salicylate (THERA-GESIC) 0.5-15 % CREA Apply 1 application. topically 4 (four)  times daily as needed (joint pain). 07/19/21  Yes [provider]  ?Methoxy PEG-Epoetin Beta (MIRCERA IJ) Mircera 04/06/21 04/05/22 Yes [provider]  ?Omega-3 Fatty Acids (FISH OIL) 1000 MG CAPS Take 2,000 mg by mouth daily.   Yes [provider]  ?polyethylene glycol powder (GLYCOLAX/MIRALAX) 17 GM/SCOOP powder Take 17 g by mouth 2 (two) times daily as needed for mild constipation. 01/28/22  Yes [provider]  ?rosuvastatin (CRESTOR) 20 MG tablet Take 20 mg by mouth at bedtime. 07/08/21  Yes [provider]  ?senna-docusate (SENOKOT-S) 8.6-50 MG tablet Take 2 tablets by mouth  daily. 07/19/21  Yes [provider]  ?sevelamer carbonate (RENVELA) 800 MG tablet Take 1,600 mg by mouth with breakfast, with lunch, and with evening meal. 02/06/22  Yes [provider]  ?heparin 1000 unit/mL SOLN injection Heparin Sodium (Porcine) 1,000 Units/mL Systemic 02/09/21 02/08/22  [provider]  ?lidocaine (XYLOCAINE) 5 % ointment Apply 1 application. topically 2 (two) times daily as needed for mild pain. 07/19/21   [provider]  ? ? ? ?Vitals:  ? 03/07/22 0750 03/07/22 1215 03/07/22 1230 03/07/22 1245  ?BP: (!) 188/89 (!) 150/75 (!) 147/74 (!) 141/72  ?Pulse: 75 70 73 76  ?Resp: '18 15 14 15  '$ ?Temp: 98.5 ?F (36.9 ?C) 97.9 ?F (36.6 ?C)    ?TempSrc: Oral     ?SpO2: 94% 93% 93% 94%  ?Weight: 83.9 kg     ?Height: '5\' 9"'$  (1.753 m)     ? ?Exam ?Gen alert, no distress ?No rash, cyanosis or gangrene ?Sclera anicteric, throat clear  ?No jvd , bruising about the L neck area ?Chest clear bilat to bases, no rales/ wheezing ?RRR no RG ?Abd soft ntnd no mass or ascites +bs ?GU normal male ?MS no joint effusions or deformity ?Ext no LE or UE edema, no wounds or ulcers ?Neuro is alert, Ox 3 , nf ?   LUA AVF +thrill ? ? ? ? ? Home meds include - asa, sensipar, plavix, pepcid, neurontin 100 tid, synthroid, crestor, renvela 2 ac tid, prns/ vits/ supps ? ? ? ? OP HD: MWF [facility] ? 3h  350/600   87.5kg  2/2 bath  P2  AVF  Heparin none ? - on 02/21/22 > Hep B Ag neg, hep B Ab's high/ protective ? - sensipar 30 mg tiw ? - venofer 50 /wk ? - mircera 50 ug q4 wks, last 3/01, due 3/29 ? - rocaltrol 1.5 ug tiw ? ?Assessment/ Plan: ?SP L CEA - per [facility] done on 03/07/22 ?Recent CVA - in early March 2023 ?ESRD - on HD MWF. Plan for 1st shift HD tomorrow.  ?Hypertension/volume  - euvolemic on exam ?Anemia of CKD - Hgb >11, no esa needs here. Last OP esa was 3/01. Cont weekly IV Fe.  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 Phos and CCa pending. Cont sensipar, vdra w/ HD.  ?  ? ? ? ?Noah Splinter  MD ?03/07/2022, 1:00  PM ?Recent Labs  ?Lab 03/07/22 ?0821  ?HGB 12.6*  ?CREATININE 7.00*  ?K 4.0  ? ? ?

## 2022-03-07 NOTE — Progress Notes (Signed)
Mobility Specialist Progress Note ? ? 03/07/22 1949  ?Mobility  ?Activity Ambulated with assistance in room  ?Level of Assistance Minimal assist, patient does 75% or more  ?Assistive Device  ?(HHA)  ?Distance Ambulated (ft) 24 ft  ?Activity Response Tolerated well  ?$Mobility charge 1 Mobility  ? ?Received pt in bed on 2LO2 having no complaints and agreeable to mobility. No physical assist to EOB or upon standing required, guarded for safety. Pt presenting w/ minimal unsteadiness but able to maintain and improve balance w/ HHA. Pt able to demonstrate fwd, bwd and lateral steps w/o fault but requiring increase time. Pt does have a slight anterior lean but corrected w/ minA. Returned back to EOB where assistance for LE  was required to get back supine d/t gen weakness. Left call bell by side.   ? ?SpO2 >89% throughout on RA, left pt on RA and notified RN.   ? ?Pre Mobility: 87 HR, 99% SpO2 on 2LO2 ?During Mobility: 102 HR, 90% SpO2 on RA ?Post Mobility: 90 HR,129/76 BP, 92% SpO2 on RA ? ?Holland Falling ?Mobility Specialist ?Phone Number [phone] ? ?

## 2022-03-07 NOTE — Discharge Instructions (Signed)
   [facility]  Discharge Instructions   Carotid Surgery  Please refer to the following instructions for your post-procedure care. Your surgeon or physician assistant will discuss any changes with you.  Activity  You are encouraged to walk as much as you can. You can slowly return to normal activities but must avoid strenuous activity and heavy lifting until your doctor tell you it's okay. Avoid activities such as vacuuming or swinging a golf club. You can drive after one week if you are comfortable and you are no longer taking prescription pain medications. It is normal to feel tired for serval weeks after your surgery. It is also normal to have difficulty with sleep habits, eating, and bowel movements after surgery. These will go away with time.  Bathing/Showering  Shower daily after you go home. Do not soak in a bathtub, hot tub, or swim until the incision heals completely.  Incision Care  Shower every day. Clean your incision with mild soap and water. Pat the area dry with a clean towel. You do not need a bandage unless otherwise instructed. Do not apply any ointments or creams to your incision. You may have skin glue on your incision. Do not peel it off. It will come off on its own in about one week. Your incision may feel thickened and raised for several weeks after your surgery. This is normal and the skin will soften over time.   For Men Only: It's okay to shave around the incision but do not shave the incision itself for 2 weeks. It is common to have numbness under your chin that could last for several months.  Diet  Resume your normal diet. There are no special food restrictions following this procedure. A low fat/low cholesterol diet is recommended for all patients with vascular disease. In order to heal from your surgery, it is CRITICAL to get adequate nutrition. Your body requires vitamins, minerals, and protein. Vegetables are the best source of  vitamins and minerals. Vegetables also provide the perfect balance of protein. Processed food has little nutritional value, so try to avoid this.  Medications  Resume taking all of your medications unless your doctor or physician assistant tells you not to. If your incision is causing pain, you may take over-the- counter pain relievers such as acetaminophen (Tylenol). If you were prescribed a stronger pain medication, please be aware these medications can cause nausea and constipation. Prevent nausea by taking the medication with a snack or meal. Avoid constipation by drinking plenty of fluids and eating foods with a high amount of fiber, such as fruits, vegetables, and grains.   Do not take Tylenol if you are taking prescription pain medications.  Follow Up  Our office will schedule a follow up appointment 2-3 weeks following discharge.  Please call us immediately for any of the following conditions  . Increased pain, redness, drainage (pus) from your incision site. . Fever of 101 degrees or higher. . If you should develop stroke (slurred speech, difficulty swallowing, weakness on one side of your body, loss of vision) you should call 911 and go to the nearest emergency room. .  Reduce your risk of vascular disease:  . Stop smoking. If you would like help call [facility] at 1-800-QUIT-NOW (1-800-784-8669) or [place] at 336-586-4000. . Manage your cholesterol . Maintain a desired weight . Control your diabetes . Keep your blood pressure down .  If you have any questions, please call the office at [phone]. 

## 2022-03-07 NOTE — Op Note (Signed)
? ? ?Patient name: Noah Osborn MRN: [id number] DOB: [birth year] Sex: adult ? ?03/07/2022 ?Pre-operative Diagnosis: Symptomatic left carotid stenosis ?Post-operative diagnosis:  Same ?Surgeon:  Eda Paschal. Donzetta Matters, MD ?Assistants: Gae Gallop, MD; Leontine Locket, PA ?Procedure Performed:  Left carotid endarterectomy with bovine pericardial patch angioplasty ? ?Indications: 88 year old male with symptomatic left carotid artery stenosis.  We have discussed the risk benefits alternatives of surgical intervention and he demonstrates good understanding and agrees to proceed with left carotid endarterectomy. ? ? ?Given the complexity of the case,  the assistant was necessary in order to expedient the procedure and safely perform the technical aspects of the operation.  The assistant provided traction and countertraction to assist with exposure of the common carotid artery, external carotid artery, and internal carotid artery.  They also assisted with suture ligatures and dividing the facial vein and multiple small venous branches tethering the hypoglossal nerve. The assistant also played a critical role in placing the shunt safely.  In addition they were necessary to provide adequate traction and countertraction to perform a precise endarterectomy and precise closure.  These skills, especially following the Prolene suture for the anastomosis, could not have been adequately performed by a scrub tech assistant.  ? ? ?Findings: The ICA was quite tortuous.  There was a long nerve branch overriding the ICA that initially was thought to be the ansa but I could not traced back to the hypoglossal and I was concerned was a nonrecurrent laryngeal nerve and thus was not divided.  The hypoglossal nerve was readily divided and protected.  The ICA stenosis appeared ulcerated and hemorrhagic.  There was pulsatile backbleeding from the ICA and thus no shunt was placed.  At completion there were expected signals in the ICA with high  resistance signals in the ECA.  Patient was neurologically intact upon awakening from anesthesia. ?  ?Procedure:  The patient was identified in the holding area and taken to the operating where is placed supine operative table and general anesthesia was induced.  He was sterilely prepped and draped in the left neck and chest in usual fashion, antibiotics were minister timeout was called.  We began with a longitudinal incision along the anterior border the sternocleidomastoid curved off to be 1 fingerbreadth below his mandibular notch.  We dissected down through the platysma and the sternocleidomastoid was identified retracted laterally.  The ICA was identified and then the common carotid artery the patient was fully heparinized and we encircled the common carotid artery with umbilical tape.  We then divided the facial vein to demonstrate the ICA bifurcation.  There were small veins divided between clips.  The hypoglossal nerve was identified as well as a long branch of the nerve anterior to the ICA.  I attempted to traced this back to the hypoglossal to prove it was the ansa cervicalis but I could not traced it back and was concerned with a known recurrent laryngeal nerve.  For this reason it was not divided.  I dissected high up to the ICA where it was noted to be normal externally appearing was also quite tortuous.  A vessel loop was placed around this as well as the ECA and the first branch.  We prepared a 12 [place] shunt.  Blood pressure was adequately elevated and the ICA was clamped followed by the common carotid artery and in the external carotid arteries sequentially.  I opened the vessel longitudinally and then thoroughly irrigated with heparinized saline.  I checked for backbleeding and it was  pulsatile from the ICA and thus no shunt was placed.  We proceeded with endarterectomy with a good smooth tapering distally.  There was also good backbleeding from the ECA and we also had smooth endpoint proximally.   A bovine pericardial patch was prepared and sewn in place as a patch angioplasty.  Prior completion we allowed flushing in all directions and thoroughly irrigated the carotid bulb with heparinized saline.  Upon completion there was good signal in the ICA distally that appeared to be low resistance somewhat high resistance monophasic signal in the ECA but good pulsatility by palpation.  The wound was thoroughly irrigated and we administered 50 mg of protamine.  We then meticulously obtained hemostasis.  After hemostasis was obtained we reapproximated the platysma with Vicryl suture followed by the skin with Monocryl.  Dermabond is placed at the level of the skin.  Patient was then awakened from anesthesia having tolerated procedure without any complication.  He was noted to be neurologically intact and transferred to the recovery area in stable condition.  All counts were correct at completion. ? ?EBL: 200 cc ? ? ?[name] C. Donzetta Matters, MD ?[facility] ?Office: [phone] ?Pager: [phone] ? ? ?

## 2022-03-07 NOTE — Anesthesia Procedure Notes (Signed)
Arterial Line Insertion ?Start/End3/21/2023 9:15 AM, 03/07/2022 9:30 AM ?Performed by: Carolan Clines, CRNA, CRNA ? Patient location: Pre-op. ?Preanesthetic checklist: patient identified, IV checked, site marked, risks and benefits discussed, surgical consent, monitors and equipment checked, pre-op evaluation, timeout performed and anesthesia consent ?Lidocaine 1% used for infiltration ?Right, radial was placed ?Catheter size: 20 G ?Hand hygiene performed , maximum sterile barriers used  and Seldinger technique used ? ?Attempts: 1 ?Procedure performed without using ultrasound guided technique. ?Following insertion, dressing applied and Biopatch. ?Post procedure assessment: normal and unchanged ? ?Patient tolerated the procedure well with no immediate complications. ? ? ?

## 2022-03-08 ENCOUNTER — Encounter (HOSPITAL_COMMUNITY): Payer: Self-pay | Admitting: Vascular Surgery

## 2022-03-08 LAB — LIPID PANEL
Cholesterol: 106 mg/dL (ref 0–200)
HDL: 55 mg/dL (ref >40)
LDL Cholesterol: 40 mg/dL (ref 0–99)
Total CHOL/HDL Ratio: 1.9 RATIO
Triglycerides: 57 mg/dL (ref <150)
VLDL: 11 mg/dL (ref 0–40)

## 2022-03-08 LAB — BASIC METABOLIC PANEL
Anion gap: 15 (ref 5–15)
BUN: 50 mg/dL — ABNORMAL HIGH (ref 8–23)
CO2: 24 mmol/L (ref 22–32)
Calcium: 9.1 mg/dL (ref 8.9–10.3)
Chloride: 94 mmol/L — ABNORMAL LOW (ref 98–111)
Creatinine, Ser: 7.63 mg/dL — ABNORMAL HIGH (ref 0.61–1.24)
GFR, Estimated: 6 mL/min — ABNORMAL LOW (ref >60)
Glucose, Bld: 132 mg/dL — ABNORMAL HIGH (ref 70–99)
Potassium: 4.9 mmol/L (ref 3.5–5.1)
Sodium: 133 mmol/L — ABNORMAL LOW (ref 135–145)

## 2022-03-08 LAB — CBC
HCT: 32.1 % — ABNORMAL LOW (ref 39.0–52.0)
Hemoglobin: 10.5 g/dL — ABNORMAL LOW (ref 13.0–17.0)
MCH: 33.8 pg (ref 26.0–34.0)
MCHC: 32.7 g/dL (ref 30.0–36.0)
MCV: 103.2 fL — ABNORMAL HIGH (ref 80.0–100.0)
Platelets: 180 10*3/uL (ref 150–400)
RBC: 3.11 MIL/uL — ABNORMAL LOW (ref 4.22–5.81)
RDW: 14 % (ref 11.5–15.5)
WBC: 11.5 10*3/uL — ABNORMAL HIGH (ref 4.0–10.5)
nRBC: 0 % (ref 0.0–0.2)

## 2022-03-08 MED ORDER — TRAMADOL HCL 50 MG PO TABS: 50.0000 mg | ORAL_TABLET | Freq: Four times a day (QID) | ORAL | 0 refills | Status: AC | PRN

## 2022-03-08 NOTE — Progress Notes (Signed)
PHARMACIST LIPID MONITORING ? ? ?Noah Osborn is a 88 y.o. adult admitted on 03/07/2022 with CEA.  Pharmacy has been consulted to optimize lipid-lowering therapy with the indication of secondary prevention for clinical ASCVD. ? ?Recent Labs: ? ?Lipid Panel (last 6 months):   ?Lab Results  ?Component Value Date  ? CHOL 106 03/08/2022  ? TRIG 57 03/08/2022  ? HDL 55 03/08/2022  ? CHOLHDL 1.9 03/08/2022  ? VLDL 11 03/08/2022  ? [name] 40 03/08/2022  ? ? ?Hepatic function panel (last 6 months):   ?Lab Results  ?Component Value Date  ? AST 20 02/28/2022  ? ALT 11 02/28/2022  ? ALKPHOS 57 02/28/2022  ? BILITOT 0.5 02/28/2022  ? ? ?SCr (since admission):   ?Serum creatinine: 7.63 mg/dL (H) 03/08/22 0430 ?Estimated creatinine clearance: 5.2 mL/min (A) (Male) ?Estimated creatinine clearance: 6.4 mL/min (A) (Male) ? ?Current therapy and lipid therapy tolerance ?Current lipid-lowering therapy: Crestor 20 mg ?Previous lipid-lowering therapies (if applicable):  ?Documented or reported allergies or intolerances to lipid-lowering therapies (if applicable): pravastatin, simvastatin ? ?Assessment:   ?LDL at goal on crestor 20 mg- which is above recommended dose of 10 mg in HD patients, but pt appears to be tolerating. ? ?Plan:   ? ?1.Statin intensity (high intensity recommended for all patients regardless of the LDL):  No statin changes. The patient is already on a high intensity statin. ? ?2.Add ezetimibe (if any one of the following):   Not indicated at this time. ? ?3.Refer to lipid clinic:   No ? ?4.Follow-up with:  Primary care provider - Noah Dress, MD ? ?5.Follow-up labs after discharge:  No changes in lipid therapy, repeat a lipid panel in one year.    ? ? ? ?Noah Osborn, PharmD ?03/08/2022, 1:59 PM ? ?

## 2022-03-08 NOTE — Progress Notes (Addendum)
?[facility] ?Progress Note  ? ?Subjective: Seen on HD. Says he is tired, didn't sleep well last night but otherwise OK.   ? ?Objective ?Vitals:  ? 03/08/22 0930 03/08/22 1000 03/08/22 1030 03/08/22 1100  ?BP: (!) 88/52 (!) 97/54 112/82 (!) 115/57  ?Pulse:      ?Resp: '16 15 18 16  '$ ?Temp:      ?TempSrc:      ?SpO2:      ?Weight:      ?Height:      ? ?Physical Exam ?General: Pleasant, very elderly male who looks younger than stated age in NAD ?HEENT: bruising noted on neck. L neck incision intact. ?Heart: S1,S2, RRR No M/R/G ?Lungs: CTAB Anteriorly ?Abdomen: NABS, NT ?Extremities: No LE edema ?Dialysis Access: L AVF cannulated ? ? ?Additional Objective ?Labs: ?Basic Metabolic Panel: ?Recent Labs  ?Lab 03/07/22 ?0821 03/07/22 ?1438 03/08/22 ?0430  ?NA 138  --  133*  ?K 4.0  --  4.9  ?CL 96*  --  94*  ?CO2  --   --  24  ?GLUCOSE 95  --  132*  ?BUN 38*  --  50*  ?CREATININE 7.00*  --  7.63*  ?CALCIUM  --   --  9.1  ?PHOS  --  5.2*  --   ? ?Liver Function Tests: ?No results for input(s): AST, ALT, ALKPHOS, BILITOT, PROT, ALBUMIN in the last 168 hours. ?No results for input(s): LIPASE, AMYLASE in the last 168 hours. ?CBC: ?Recent Labs  ?Lab 03/07/22 ?0821 03/08/22 ?0430  ?WBC  --  11.5*  ?HGB 12.6* 10.5*  ?HCT 37.0* 32.1*  ?MCV  --  103.2*  ?PLT  --  180  ? ?Blood Culture ?No results found for: SDES, [place], [place], REPTSTATUS ? ?Cardiac Enzymes: ?No results for input(s): CKTOTAL, CKMB, CKMBINDEX, TROPONINI in the last 168 hours. ?CBG: ?No results for input(s): GLUCAP in the last 168 hours. ?Iron Studies: No results for input(s): IRON, TIBC, TRANSFERRIN, FERRITIN in the last 72 hours. ?'@lablastinr3'$ @ ?Studies/Results: ?No results found. ?Medications: ? sodium chloride    ? ferric gluconate (FERRLECIT) IVPB 62.5 mg (03/08/22 1022)  ? magnesium sulfate bolus IVPB    ? vancomycin 1,000 mg (03/08/22 1026)  ? ? aspirin EC  81 mg Oral Daily  ? calcitRIOL  1.5 mcg Oral Q M,W,F-HD  ? Chlorhexidine Gluconate Cloth   6 each Topical Q0600  ? cinacalcet  30 mg Oral Q M,W,F-HD  ? docusate sodium  100 mg Oral Daily  ? gabapentin  100 mg Oral TID  ? levothyroxine  88 mcg Oral QAC breakfast  ? pantoprazole  40 mg Oral Daily  ? rosuvastatin  20 mg Oral QHS  ? senna-docusate  2 tablet Oral Daily  ? sevelamer carbonate  1,600 mg Oral TID with meals  ? ? ?OP HD: MWF [facility] ? 3h  350/600   87.5kg  2/2 bath  P2  AVF  Heparin none ? - on 02/21/22 > Hep B Ag neg, hep B Ab's high/ protective ? - sensipar 30 mg tiw ? - venofer 50 /wk ? - mircera 50 ug q4 wks, last 3/01, due 3/29 ? - rocaltrol 1.5 ug tiw ?  ?Assessment/ Plan: ?SP L CEA - per VVS done on 03/07/22. PO Day #1. For DC home post HD per [facility] notes.  ?Recent CVA - in early March 2023 ?ESRD - on HD MWF. HD today in progress.  ?Hypertension/volume  - euvolemic on exam. Minimal UF.  ?Anemia of CKD - Hgb >  11, no esa needs here. Last OP esa was 3/01. Cont weekly IV Fe.  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 Phos and CCa pending. Cont sensipar, vdra w/ HD.  ? ?Noah Osborn. Brown NP-C ?03/08/2022, 11:11 AM  ?[facility] ?[phone] ? ?Pt seen, examined and agree w A/P as above.  ?Kelly Splinter  MD ?03/08/2022, 2:22 PM ? ? ?  ? ?

## 2022-03-08 NOTE — Progress Notes (Signed)
D/C order noted when navigator arrived this am. Pt was already receiving HD at that time. Contacted [facility] to make clinic aware of pt's d/c today and pt will resume at clinic on Friday.  ? ?Melven Sartorius ?Renal Navigator ?[phone] ?

## 2022-03-08 NOTE — Progress Notes (Addendum)
?  Progress Note ? ? ? ?03/08/2022 ?7:54 AM ?1 Day Post-Op ? ?Subjective:  no complaints. Just finished breakfast. Says neck a little sore but overall feeling good ? ? ?Vitals:  ? 03/08/22 0300 03/08/22 0400  ?BP: (!) 109/55 (!) 119/56  ?Pulse: 72 73  ?Resp: 16 17  ?Temp:  97.6 ?F (36.4 ?C)  ?SpO2: 90% 92%  ? ?Physical Exam: ?Cardiac:  regular ?Lungs:  non labored ?Incisions:  left neck incision is clean, dry and intact without hematoma. Ecchymosis present. Mild swelling but soft ?Extremities:  moving all extremities without deficits ?Abdomen:  soft, non distended ?Neurologic: alert and oriented. Smile symmetric. Tongue midline. Speech coherent. Sensation intact and equal bilaterally ? ?CBC ?   ?Component Value Date/Time  ? WBC 11.5 (H) 03/08/2022 0430  ? RBC 3.11 (L) 03/08/2022 0430  ? HGB 10.5 (L) 03/08/2022 0430  ? HCT 32.1 (L) 03/08/2022 0430  ? PLT 180 03/08/2022 0430  ? MCV 103.2 (H) 03/08/2022 0430  ? MCH 33.8 03/08/2022 0430  ? MCHC 32.7 03/08/2022 0430  ? RDW 14.0 03/08/2022 0430  ? LYMPHSABS 1.4 02/22/2022 0100  ? MONOABS 0.5 02/22/2022 0100  ? EOSABS 0.2 02/22/2022 0100  ? BASOSABS 0.0 02/22/2022 0100  ? ? ?BMET ?   ?Component Value Date/Time  ? NA 133 (L) 03/08/2022 0430  ? K 4.9 03/08/2022 0430  ? CL 94 (L) 03/08/2022 0430  ? CO2 24 03/08/2022 0430  ? GLUCOSE 132 (H) 03/08/2022 0430  ? BUN 50 (H) 03/08/2022 0430  ? CREATININE 7.63 (H) 03/08/2022 0430  ? CALCIUM 9.1 03/08/2022 0430  ? GFRNONAA 6 (L) 03/08/2022 0430  ? ? ?INR ?   ?Component Value Date/Time  ? INR 1.1 02/28/2022 0845  ? ? ? ?Intake/Output Summary (Last 24 hours) at 03/08/2022 0754 ?Last data filed at 03/08/2022 0300 ?Gross per 24 hour  ?Intake 1300 ml  ?Output 50 ml  ?Net 1250 ml  ? ? ? ?Assessment/Plan:  88 y.o. adult is s/p Left CEA 1 Day Post-Op  ? ?Neurologically intact ?Left neck incision c/d/I. Ecchymosis present. Mild Swelling present. Soft. No hematoma ?Tolerating diet ?Mobilized without difficulty ?Hemodynamically stable ?He will  continue Aspirin, Plavix, Statin ?He is scheduled for first shift HD session this morning, plan will be for discharge after HD ?He will have follow up arranged in the office in 2-3 weeks ? ? ?Karoline Caldwell, PA-C ?[facility] ?[phone] ?03/08/2022 ?7:54 AM ? ?I have independently interviewed and examined patient and agree with PA assessment and plan above.  He tolerated dialysis without any issues this morning.  He does have small hematoma over the left neck which appears stable and he is not having any symptoms from this.  Plan will be for discharge this afternoon I will follow-up in the office for wound check in a few weeks. ? ?Tekesha Almgren C. Donzetta Matters, MD ?[facility] ?Office: [phone] ?Pager: [phone] ? ?

## 2022-03-08 NOTE — Discharge Summary (Signed)
?Carotid Discharge Summary ? ? ? ? ?Noah Osborn ?06/22/1924 88 y.o. adult ? ?[id number] ? ?Admission Date: ?03/07/2022 ? ?Discharge Date: ?03/08/3022 ? ?Physician: ?No att. providers found ? ?Admission Diagnosis: ?Carotid stenosis [I65.29] ?Symptomatic carotid artery stenosis, left [I65.22] ? ?Discharge Day services:   ? Inpatient Hemodialysis  ? ?Hospital Course:  ?The patient was admitted to the hospital and taken to the operating room on 03/07/2022 and underwent Left carotid endarterectomy.  The pt tolerated the procedure well and was transported to the PACU in good condition. ? ? By POD 1, the pt neuro status remained intact. Incision well appearing, soft, without hematoma. Ecchymosis present. Tolerating diet. Voiding without difficulty. Pain well controlled. He received inpatient hemodialysis. The remainder of the hospital course consisted of increasing mobilization and increasing intake of solids without difficulty. ? ?He remained stable for discharge home. He will remain on Asprin, Plavix and statin. PDMP was reviewed and post operative pain medication sent to patients pharmacy. He has follow up arranged in our office in 2-3 weeks for incision check. ? ?Recent Labs  ?  03/07/22 ?0821 03/08/22 ?0430  ?NA 138 133*  ?K 4.0 4.9  ?CL 96* 94*  ?CO2  --  24  ?GLUCOSE 95 132*  ?BUN 38* 50*  ?CALCIUM  --  9.1  ? ?Recent Labs  ?  03/07/22 ?0821 03/08/22 ?0430  ?WBC  --  11.5*  ?HGB 12.6* 10.5*  ?HCT 37.0* 32.1*  ?PLT  --  180  ? ?No results for input(s): INR in the last 72 hours. ? ? ?Discharge Instructions   ? ? Discharge patient   Complete by: As directed ?  ? After HD  ? Discharge disposition: 01-Home or Self Care  ? Discharge patient date: 03/08/2022  ? ?  ? ? ?Discharge Diagnosis:  ?Carotid stenosis [I65.29] ?Symptomatic carotid artery stenosis, left [I65.22] ? ?Secondary Diagnosis: ?Patient Active Problem List  ? Diagnosis Date Noted  ? Carotid stenosis 03/07/2022  ? Symptomatic carotid artery stenosis, left  03/07/2022  ? Acute ischemic stroke ([place]) 02/21/2022  ? Dizziness 02/20/2022  ? Bilateral carotid artery stenosis 02/20/2022  ? Chronic sinusitis 09/20/2021  ? 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 Constipation 09/20/2021  ? Generalized osteoarthrosis, involving multiple sites 09/20/2021  ? Headache 09/20/2021  ? Hematuria 09/20/2021  ? Presbyopia 09/20/2021  ? Renal osteodystrophy 09/20/2021  ? Sensorineural hearing loss, bilateral 09/20/2021  ? Allergic rhinitis 07/19/2021  ? Anemia in chronic kidney disease 07/19/2021  ? Obesity with body mass index 30 or greater 07/19/2021  ? End stage renal disease ([place]) 07/19/2021  ? Gastroesophageal reflux disease 07/19/2021  ? Hyperlipidemia 07/19/2021  ? Kidney stone 07/19/2021  ? Vitamin D deficiency 07/19/2021  ? DDD (degenerative disc disease), lumbar 05/26/2020  ? Coagulation defect, unspecified ([place]) 06/12/2017  ? Dyspnea, unspecified 06/12/2017  ? Pruritus, unspecified 06/12/2017  ? 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 History of colonic polyps 06/08/2017  ? Hypertensive chronic kidney disease with stage 5 chronic kidney disease or end stage renal disease ([place]) 06/08/2017  ? Hypothyroidism, unspecified 06/08/2017  ? Insomnia, unspecified 06/08/2017  ? Iron deficiency anemia 06/08/2017  ? Chronic low back pain 06/08/2017  ? Psoriasis vulgaris 06/08/2017  ? Long term (current) use of non-steroidal anti-inflammatories ([place]) 06/08/2017  ? Malignant neoplasm of prostate ([place]) 06/08/2017  ? Secondary hyperparathyroidism of renal origin ([place]) 06/08/2017  ? Unspecified urinary incontinence 06/08/2017  ? ?Past Medical History:  ?Diagnosis Date  ? Aortic stenosis   ? mod-severe by 02/20/22  echo  ? Arthritis   ? Cancer [facility]) 2004  ? prostate  ? ESRD on hemodialysis ([place])   ? M-W-F  ? GERD (gastroesophageal reflux disease)   ? Hypertension   ? Hypothyroidism   ? Stroke [facility]) 02/2022  ? ? ?Allergies as of 03/08/2022   ? ?   Reactions  ? Penicillins Anaphylaxis, Rash, Other  (See Comments)  ? Fainted like ?Fainted like  ? Pravastatin Other (See Comments)  ? Simvastatin Other (See Comments)  ? ?  ? ?  ?Medication List  ?  ? ?TAKE these medications   ? ?aspirin 81 MG EC tablet ?Take 81 mg by mouth daily. ?  ?b complex-C-folic acid 1 MG capsule ?Take 1 capsule by mouth at bedtime. ?  ?clopidogrel 75 MG tablet ?Commonly known as: PLAVIX ?Take 1 tablet (75 mg total) by mouth daily. ?  ?ethyl chloride spray ?1 application. as directed. For use before Dialysis treatment ?  ?famotidine 40 MG tablet ?Commonly known as: PEPCID ?Take 1 tablet by mouth at bedtime as needed for heartburn or indigestion. ?  ?Fish Oil 1000 MG Caps ?Take 2,000 mg by mouth daily. ?  ?gabapentin 100 MG capsule ?Commonly known as: NEURONTIN ?Take 100 mg by mouth 3 (three) times daily. ?  ?heparin 1000 unit/mL Soln injection ?Heparin Sodium (Porcine) 1,000 Units/mL Systemic ?  ?levothyroxine 88 MCG tablet ?Commonly known as: SYNTHROID ?Take 88 mcg by mouth daily before breakfast. ?  ?lidocaine 5 % ointment ?Commonly known as: XYLOCAINE ?Apply 1 application. topically 2 (two) times daily as needed for mild pain. ?  ?linaclotide 290 MCG Caps capsule ?Commonly known as: LINZESS ?Take 290 mcg by mouth daily as needed (constipation). ?  ?melatonin 3 MG Tabs tablet ?Take 3 mg by mouth at bedtime as needed (sleep). ?  ?MIRCERA IJ ?Mircera ?  ?polyethylene glycol powder 17 GM/SCOOP powder ?Commonly known as: GLYCOLAX/MIRALAX ?Take 17 g by mouth 2 (two) times daily as needed for mild constipation. ?  ?rosuvastatin 20 MG tablet ?Commonly known as: CRESTOR ?Take 20 mg by mouth at bedtime. ?  ?senna-docusate 8.6-50 MG tablet ?Commonly known as: Senokot-S ?Take 2 tablets by mouth daily. ?  ?SENSIPAR PO ?Take 1 capsule by mouth daily. ?  ?sevelamer carbonate 800 MG tablet ?Commonly known as: RENVELA ?Take 1,600 mg by mouth with breakfast, with lunch, and with evening meal. ?  ?Thera-Gesic 0.5-15 % Crea ?Apply 1 application. topically 4  (four) times daily as needed (joint pain). ?  ?traMADol 50 MG tablet ?Commonly known as: Ultram ?Take 1 tablet (50 mg total) by mouth every 6 (six) hours as needed. ?  ? ?  ? ? ? ?Discharge Instructions: ? ? ?[facility] [place] ?Discharge Instructions Carotid Endarterectomy (CEA) ? ?Please refer to the following instructions for your post-procedure care. Your surgeon or physician assistant will discuss any changes with you. ? ?Activity ? ?You are encouraged to walk as much as you can. You can slowly return to normal activities but must avoid strenuous activity and heavy lifting until your doctor tell you it's OK. Avoid activities such as vacuuming or swinging a golf club. You can drive after one week if you are comfortable and you are no longer taking prescription pain medications. It is normal to feel tired for serval weeks after your surgery. It is also normal to have difficulty with sleep habits, eating, and bowel movements after surgery. These will go away with time. ? ?Bathing/Showering ? ?You may shower after you  come home. Do not soak in a bathtub, hot tub, or swim until the incision heals completely. ? ?Incision Care ? ?Shower every day. Clean your incision with mild soap and water. Pat the area dry with a clean towel. You do not need a bandage unless otherwise instructed. Do not apply any ointments or creams to your incision. You may have skin glue on your incision. Do not peel it off. It will come off on its own in about one week. Your incision may feel thickened and raised for several weeks after your surgery. This is normal and the skin will soften over time. For Men Only: It's OK to shave around the incision but do not shave the incision itself for 2 weeks. It is common to have numbness under your chin that could last for several months. ? ?Diet ? ?Resume your normal diet. There are no special food restrictions following this procedure. A low fat/low cholesterol diet is  recommended for all patients with vascular disease. In order to heal from your surgery, it is CRITICAL to get adequate nutrition. Your body requires vitamins, minerals, and protein. Vegetables are the best source

## 2022-03-08 NOTE — Plan of Care (Signed)

## 2022-03-08 NOTE — TOC Transition Note (Signed)
Transition of Care (TOC) - CM/SW Discharge Note ?Marvetta Gibbons Therapist, sports, BSN ?Transitions of Care ?Unit 4E- RN Case Manager ?See Treatment Team for direct phone #  ? ? ?Patient Details  ?Name: Noah Osborn ?MRN: [id number] ?Date of Birth: 09-05-1924 ? ?Transition of Care (TOC) CM/SW Contact:  ?Dahlia Client, Romeo Rabon, RN ?Phone Number: ?03/08/2022, 1:13 PM ? ? ?Clinical Narrative:    ?Pt stable for transition home today after HD this am. Noted pt had been set up with HHPT/OT on last admission. Call made to [facility] with [facility] to check on [place] status. Per Meredeth Ide attempted to do start of care with VA authorization, and when pt was called to schedule initial visit- pt declined [place] services. Daughter [name] stated to [facility] that pt was close to baseline and HH was not needed. [facility] closed out referral as per pt request.  ? ?No further needs noted at this time.  ? ? ?Final next level of care: Home/Self Care ?Barriers to Discharge: No Barriers Identified ? ? ?Patient Goals and CMS Choice ?  ?  ?Choice offered to / list presented to : NA ? ?Discharge Placement ?  ?           ? Home ?  ?  ?  ? ?Discharge Plan and Services ?  ?  ?Post Acute Care Choice: NA          ?DME Arranged: N/A ?DME Agency: NA ?  ?  ?  ?HH Arranged: Patient Refused HH (post discharge pt declined [facility] per [facility]) ?[place] Agency: NA ?Date HH Agency Contacted: 03/08/22 ?Time [place]: [id number] ?Representative spoke with at [place]: Tommi Rumps ? ?Social Determinants of Health (SDOH) Interventions ?  ? ? ?Readmission Risk Interventions ? ?  03/08/2022  ?  1:13 PM  ?Readmission Risk Prevention Plan  ?Transportation Screening Complete  ?PCP or Specialist Appt within 3-5 Days Complete  ?[place] or Home Care Consult Complete  ?Social Work Consult for [place] Planning/Counseling Complete  ?Palliative Care Screening Not Applicable  ?Medication Review Press photographer) Complete  ? ? ? ? ? ?

## 2022-03-08 NOTE — Progress Notes (Signed)
Explained discharge summary to patient. Reviewed follow up appointments, incisional care instructions, and next medication administration times. Patient verbalized having an understanding. Removed patient's IV. Cath intact. Telemetry monitoring was removed prior to my discharging the patient by floor staff. All belongings were in patient's possess to include his glasses and wallet.  ?

## 2022-03-09 ENCOUNTER — Telehealth: Payer: Self-pay | Admitting: Nurse Practitioner

## 2022-03-09 NOTE — Telephone Encounter (Signed)
Transition of care contact from inpatient facility ? ?Date of Discharge: 03/08/2022 ?Date of Contact: 02/17/2022 ?Method of contact: Phone ? ?Attempted to contact patient to discuss transition of care from inpatient admission. Patient did not answer the phone. Message was left on the patient's voicemail with call back number [phone]. ? ?

## 2022-03-18 DEATH — deceased

## 2022-03-22 ENCOUNTER — Encounter: Payer: No Typology Code available for payment source | Admitting: Vascular Surgery

## 2023-04-01 IMAGING — CT CT HEAD W/O CM
4 series · 17 of 47 positions shown, 19 images · non-contrast
Comparison: None.

CLINICAL DATA: Dizziness



[Series 3: head without · axial · non-contrast · 0.44mm/px · z∈[+209,+329]mm · 7 of 34 slices shown, 9 images]
[im 5/34  brain]
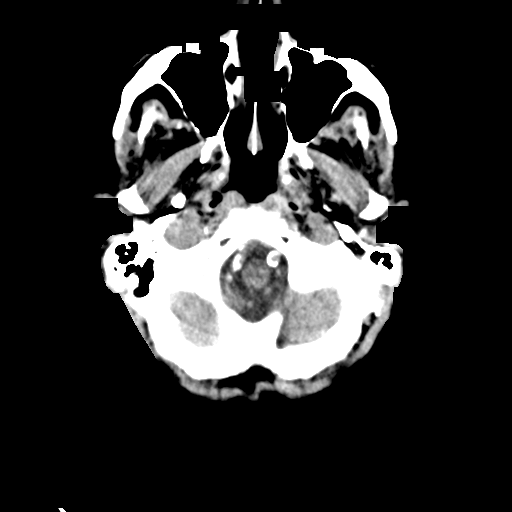
[im 5/34  bone]
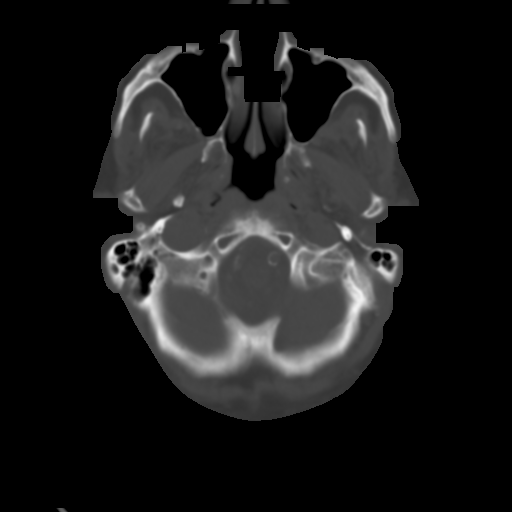
[im 9/34  brain]
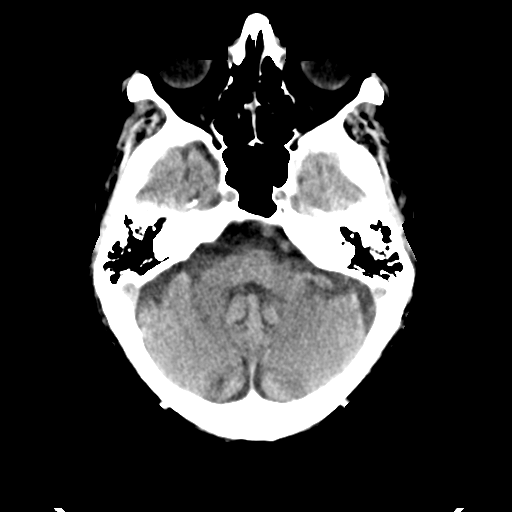
[im 13/34  brain]
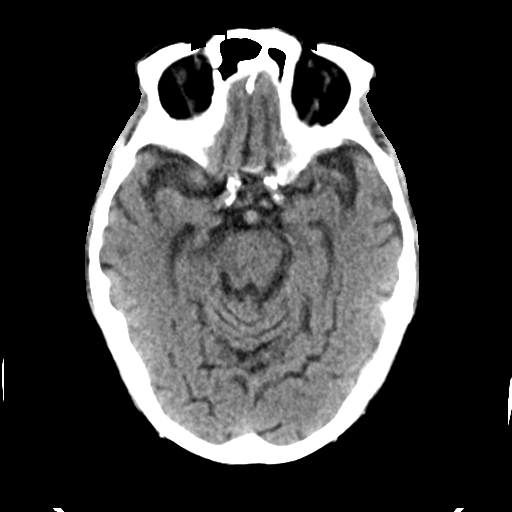
[im 17/34  brain]
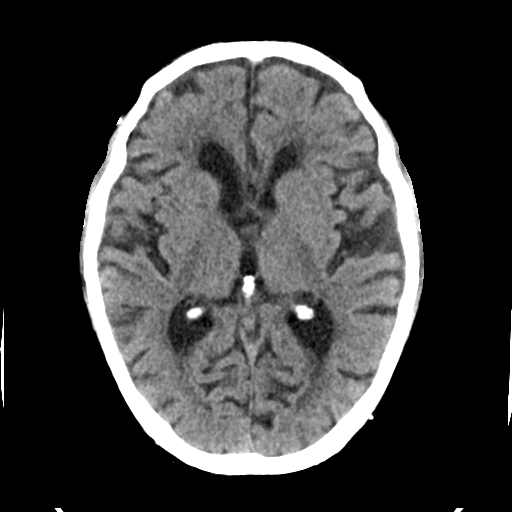
[im 21/34  brain]
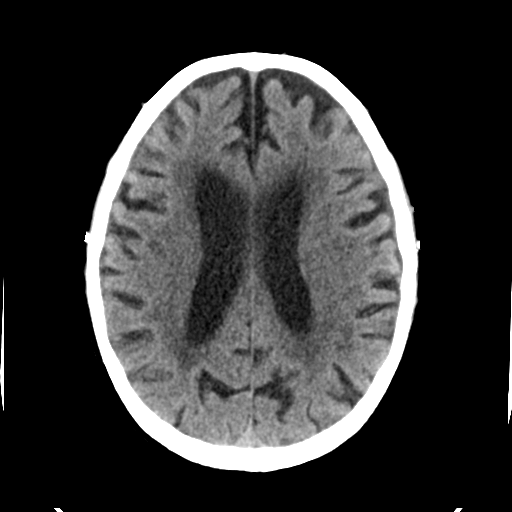
[im 21/34  bone]
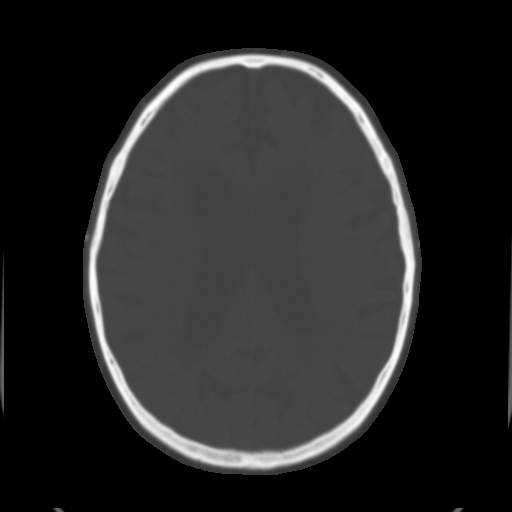
[im 25/34  brain]
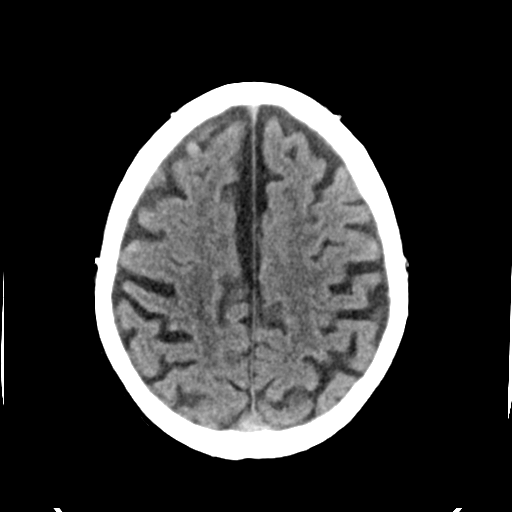
[im 29/34  brain]
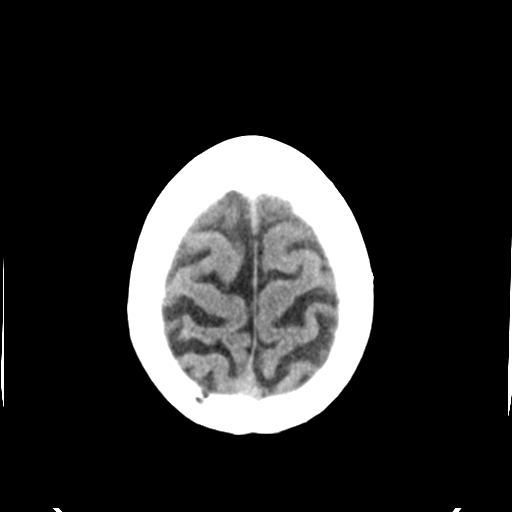

[Series 4: head bone · axial · 0.44mm/px · z∈[+205,+263]mm · 4 of 85 slices shown]
[im 9/85  bone]
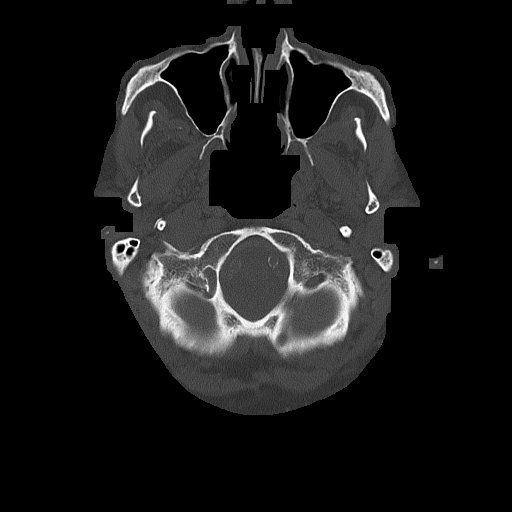
[im 17/85  bone]
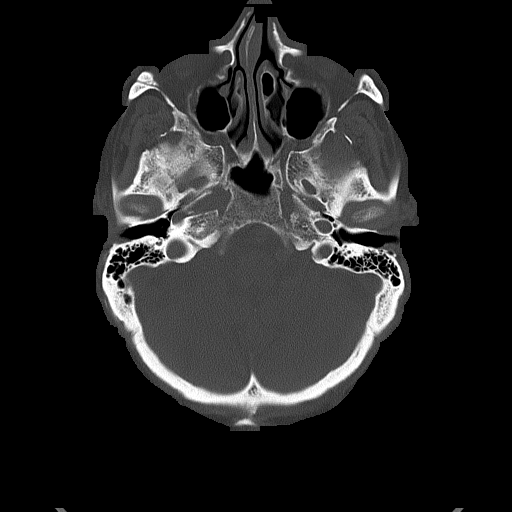
[im 26/85  bone]
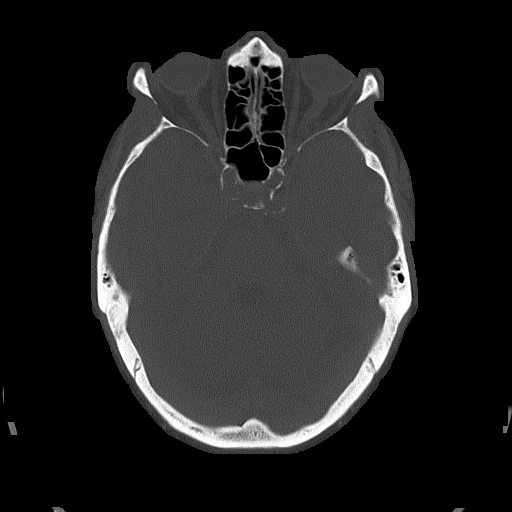
[im 38/85  bone]
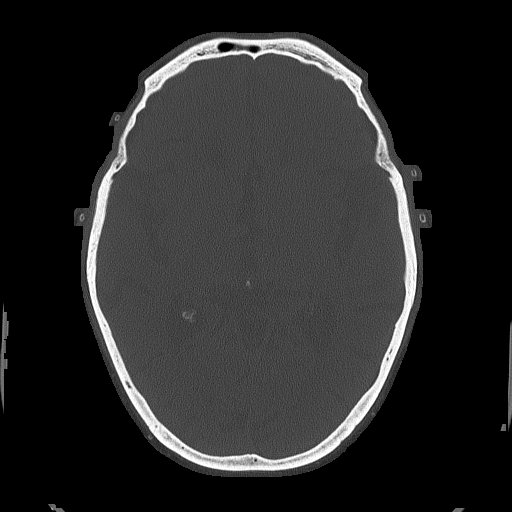

[Series 5: head without cor · coronal · non-contrast · 0.34mm/px · 3 of 70 slices shown]
[im 24/70  brain]
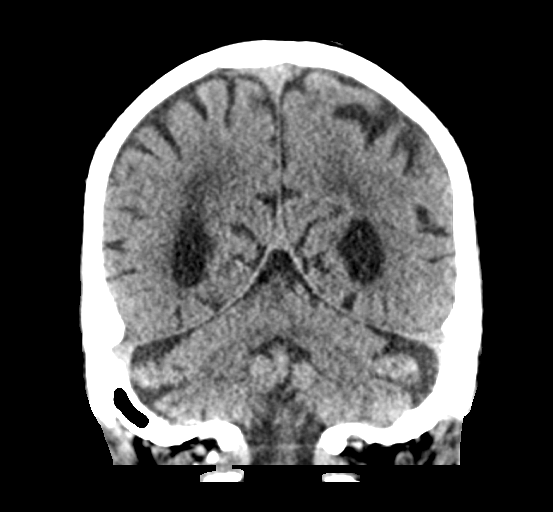
[im 31/70  brain]
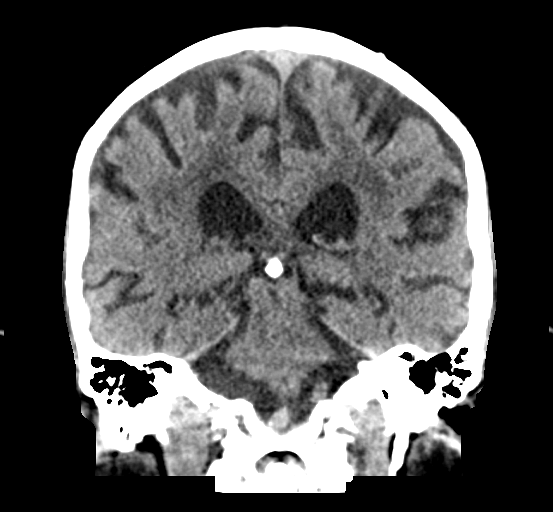
[im 39/70  brain]
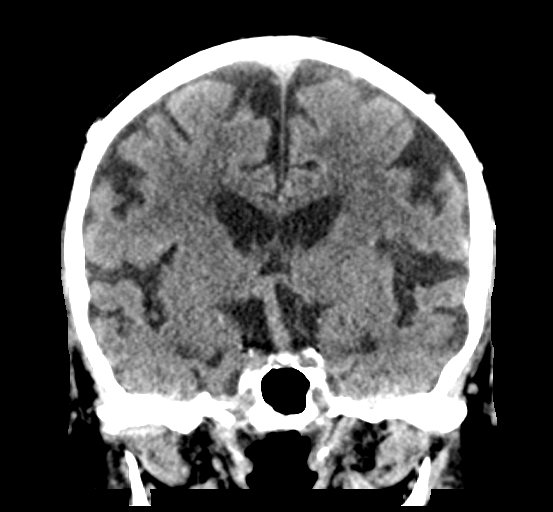

[Series 6: head without sag · sagittal · non-contrast · 0.34mm/px · 3 of 60 slices shown]
[im 20/60  brain]
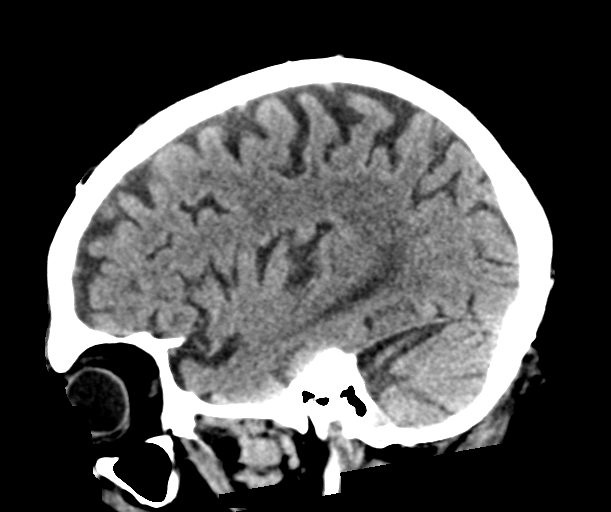
[im 30/60  brain]
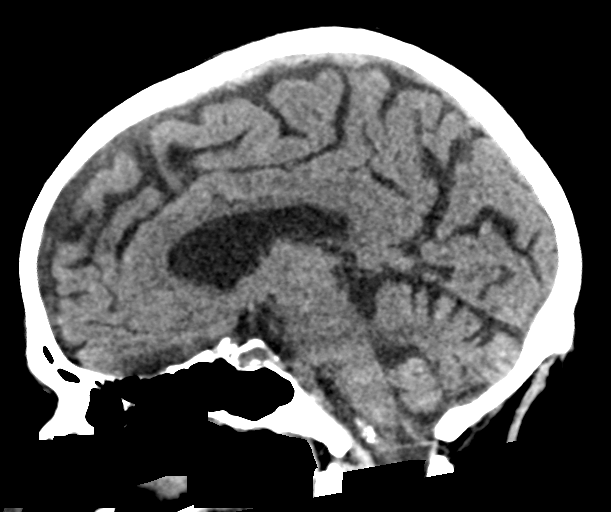
[im 40/60  brain]
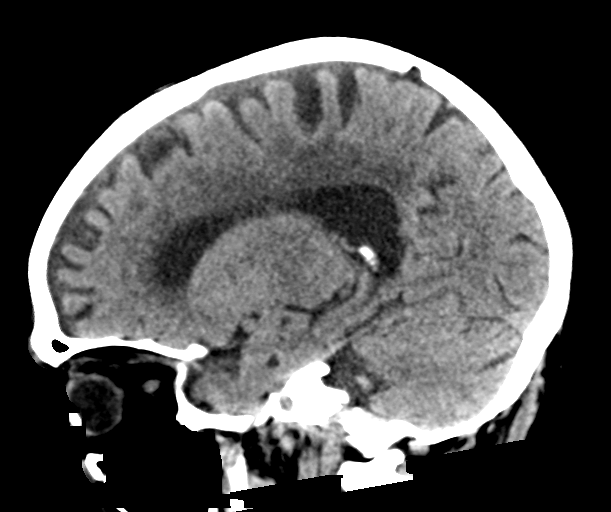

[17 of 47 positions shown; findings below may reference images not displayed]

FINDINGS: Brain: No acute intracranial findings are seen. There are no signs
of bleeding. Cortical sulci are prominent. There is decreased
density in periventricular white matter.

Vascular: There are scattered arterial calcifications.

Skull: No fracture is seen.

Sinuses/Orbits: There is mild mucosal thickening in the ethmoid
sinus. Postsurgical changes are noted in both optic globes, possibly
previous cataract surgery.

Other: None
IMPRESSION: No acute intracranial findings are seen in noncontrast CT brain.
Atrophy. Small-vessel disease.

Mild chronic sinusitis.
